# Patient Record
Sex: Female | Born: 1935 | Race: White | Hispanic: No | Marital: Married | Smoking: Former smoker
Health system: Southern US, Community
[De-identification: ages and names within clinical notes are randomized; demographics above are authoritative.]

## PROBLEM LIST (undated history)

## (undated) DIAGNOSIS — M549 Dorsalgia, unspecified: Secondary | ICD-10-CM

## (undated) DIAGNOSIS — M797 Fibromyalgia: Secondary | ICD-10-CM

## (undated) DIAGNOSIS — J189 Pneumonia, unspecified organism: Secondary | ICD-10-CM

## (undated) DIAGNOSIS — G8929 Other chronic pain: Secondary | ICD-10-CM

## (undated) DIAGNOSIS — Z8719 Personal history of other diseases of the digestive system: Secondary | ICD-10-CM

## (undated) DIAGNOSIS — J42 Unspecified chronic bronchitis: Secondary | ICD-10-CM

## (undated) DIAGNOSIS — M1990 Unspecified osteoarthritis, unspecified site: Secondary | ICD-10-CM

## (undated) DIAGNOSIS — I10 Essential (primary) hypertension: Secondary | ICD-10-CM

## (undated) DIAGNOSIS — Z950 Presence of cardiac pacemaker: Secondary | ICD-10-CM

## (undated) DIAGNOSIS — G43909 Migraine, unspecified, not intractable, without status migrainosus: Secondary | ICD-10-CM

## (undated) DIAGNOSIS — E785 Hyperlipidemia, unspecified: Secondary | ICD-10-CM

## (undated) DIAGNOSIS — I480 Paroxysmal atrial fibrillation: Secondary | ICD-10-CM

## (undated) DIAGNOSIS — C50912 Malignant neoplasm of unspecified site of left female breast: Secondary | ICD-10-CM

## (undated) DIAGNOSIS — K5792 Diverticulitis of intestine, part unspecified, without perforation or abscess without bleeding: Secondary | ICD-10-CM

## (undated) DIAGNOSIS — K219 Gastro-esophageal reflux disease without esophagitis: Secondary | ICD-10-CM

## (undated) DIAGNOSIS — I714 Abdominal aortic aneurysm, without rupture, unspecified: Secondary | ICD-10-CM

## (undated) DIAGNOSIS — I739 Peripheral vascular disease, unspecified: Secondary | ICD-10-CM

## (undated) DIAGNOSIS — Z9289 Personal history of other medical treatment: Secondary | ICD-10-CM

## (undated) DIAGNOSIS — I2510 Atherosclerotic heart disease of native coronary artery without angina pectoris: Secondary | ICD-10-CM

## (undated) DIAGNOSIS — Z9981 Dependence on supplemental oxygen: Secondary | ICD-10-CM

## (undated) DIAGNOSIS — F329 Major depressive disorder, single episode, unspecified: Secondary | ICD-10-CM

## (undated) DIAGNOSIS — E039 Hypothyroidism, unspecified: Secondary | ICD-10-CM

## (undated) DIAGNOSIS — J449 Chronic obstructive pulmonary disease, unspecified: Secondary | ICD-10-CM

## (undated) DIAGNOSIS — K254 Chronic or unspecified gastric ulcer with hemorrhage: Secondary | ICD-10-CM

## (undated) DIAGNOSIS — J439 Emphysema, unspecified: Secondary | ICD-10-CM

## (undated) DIAGNOSIS — D649 Anemia, unspecified: Secondary | ICD-10-CM

## (undated) DIAGNOSIS — I219 Acute myocardial infarction, unspecified: Secondary | ICD-10-CM

## (undated) DIAGNOSIS — I7090 Unspecified atherosclerosis: Secondary | ICD-10-CM

## (undated) DIAGNOSIS — F32A Depression, unspecified: Secondary | ICD-10-CM

## (undated) DIAGNOSIS — F419 Anxiety disorder, unspecified: Secondary | ICD-10-CM

## (undated) HISTORY — PX: CATARACT EXTRACTION W/ INTRAOCULAR LENS  IMPLANT, BILATERAL: SHX1307

## (undated) HISTORY — PX: POSTERIOR LUMBAR FUSION: SHX6036

## (undated) HISTORY — PX: SHOULDER ARTHROSCOPY W/ ROTATOR CUFF REPAIR: SHX2400

## (undated) HISTORY — PX: CORONARY ANGIOPLASTY WITH STENT PLACEMENT: SHX49

## (undated) HISTORY — DX: Peripheral vascular disease, unspecified: I73.9

## (undated) HISTORY — DX: Essential (primary) hypertension: I10

## (undated) HISTORY — PX: TOTAL KNEE ARTHROPLASTY: SHX125

## (undated) HISTORY — DX: Gastro-esophageal reflux disease without esophagitis: K21.9

## (undated) HISTORY — PX: CAROTID ENDARTERECTOMY: SUR193

## (undated) HISTORY — DX: Atherosclerotic heart disease of native coronary artery without angina pectoris: I25.10

## (undated) HISTORY — PX: APPENDECTOMY: SHX54

## (undated) HISTORY — DX: Unspecified atherosclerosis: I70.90

## (undated) HISTORY — PX: FEMORAL BYPASS: SHX50

## (undated) HISTORY — PX: BLADDER SURGERY: SHX569

## (undated) HISTORY — PX: FOOT SURGERY: SHX648

## (undated) HISTORY — PX: BREAST BIOPSY: SHX20

## (undated) HISTORY — PX: MASTECTOMY: SHX3

## (undated) HISTORY — PX: INSERTION OF ILIAC STENT: SHX6256

## (undated) HISTORY — DX: Chronic obstructive pulmonary disease, unspecified: J44.9

## (undated) HISTORY — PX: TUBAL LIGATION: SHX77

## (undated) HISTORY — PX: INSERT / REPLACE / REMOVE PACEMAKER: SUR710

## (undated) HISTORY — PX: JOINT REPLACEMENT: SHX530

## (undated) HISTORY — DX: Hyperlipidemia, unspecified: E78.5

## (undated) HISTORY — DX: Hypothyroidism, unspecified: E03.9

## (undated) HISTORY — PX: BACK SURGERY: SHX140

## (undated) HISTORY — DX: Diverticulitis of intestine, part unspecified, without perforation or abscess without bleeding: K57.92

## (undated) HISTORY — DX: Paroxysmal atrial fibrillation: I48.0

## (undated) HISTORY — PX: CARPAL TUNNEL RELEASE: SHX101

## (undated) HISTORY — PX: CORONARY ARTERY BYPASS GRAFT: SHX141

---

## 2008-05-25 ENCOUNTER — Encounter: Admission: RE | Admit: 2008-05-25 | Discharge: 2008-05-25 | Payer: Self-pay | Admitting: Surgery

## 2008-05-25 ENCOUNTER — Other Ambulatory Visit: Admission: RE | Admit: 2008-05-25 | Discharge: 2008-05-25 | Payer: Self-pay | Admitting: Interventional Radiology

## 2008-05-25 ENCOUNTER — Encounter (INDEPENDENT_AMBULATORY_CARE_PROVIDER_SITE_OTHER): Payer: Self-pay | Admitting: Interventional Radiology

## 2008-07-30 HISTORY — PX: TOTAL THYROIDECTOMY: SHX2547

## 2008-08-03 ENCOUNTER — Encounter (INDEPENDENT_AMBULATORY_CARE_PROVIDER_SITE_OTHER): Payer: Self-pay | Admitting: Surgery

## 2008-08-03 ENCOUNTER — Ambulatory Visit (HOSPITAL_COMMUNITY): Admission: RE | Admit: 2008-08-03 | Discharge: 2008-08-04 | Payer: Self-pay | Admitting: Surgery

## 2009-12-03 ENCOUNTER — Emergency Department (HOSPITAL_COMMUNITY): Admission: EM | Admit: 2009-12-03 | Discharge: 2009-12-03 | Payer: Self-pay | Admitting: Emergency Medicine

## 2010-10-21 ENCOUNTER — Encounter: Payer: Self-pay | Admitting: Surgery

## 2010-12-22 LAB — POCT I-STAT, CHEM 8
Calcium, Ion: 1.13 mmol/L (ref 1.12–1.32)
Glucose, Bld: 96 mg/dL (ref 70–99)
HCT: 42 % (ref 36.0–46.0)
Hemoglobin: 14.3 g/dL (ref 12.0–15.0)
Potassium: 4 mEq/L (ref 3.5–5.1)

## 2011-02-11 NOTE — Op Note (Signed)
Rita Holmes, Rita Holmes                ACCOUNT NO.:  [id number]   MEDICAL RECORD NO.:  [id number]          PATIENT TYPE:  OIB   LOCATION:  5114                         FACILITY:  [facility]   PHYSICIAN:  Velora Heckler, MD      DATE OF BIRTH:  06/13/1931   DATE OF PROCEDURE:  08/03/2008  DATE OF DISCHARGE:                               OPERATIVE REPORT   PREOPERATIVE DIAGNOSIS:  Bilateral thyroid nodules with cytologic  atypia.   POSTOPERATIVE DIAGNOSIS:  Bilateral thyroid nodules with cytologic  atypia.   PROCEDURE:  Total thyroidectomy.   SURGEON:  Velora Heckler, MD, FACS   ASSISTANT:  Currie Paris, MD, FACS   ANESTHESIA:  General.   ESTIMATED BLOOD LOSS:  Minimal.   PREPARATION:  ChloraPrep.   COMPLICATIONS:  None.   INDICATIONS:  The patient is a 77 year old white female from [place],  [place].  The patient was evaluated by Dr. Nadine Counts for  bilateral thyroid nodules.  The patient notes that the nodules had  increased in size over the past year.  CT scan in June 2009 at [facility] showed a mass in the left thyroid lobe.  Other nodules were  noted in the right thyroid lobe.  The patient underwent fine-needle  aspiration biopsy.  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  The patient now comes to surgery for resection of the  thyroid for definitive diagnosis.   BODY OF REPORT:  Procedure was done in OR #16 at the [facility].  The patient was brought to the operating room and  placed in a supine position on the operating room table.  Following  administration of general anesthesia, the patient was prepped and draped  in the usual strict aseptic fashion.  After ascertaining that an  adequate level of anesthesia had been achieved, a Kocher incision was  made with a #15 blade.  Dissection was carried through subcutaneous  tissues and platysma.  Hemostasis was obtained with the electrocautery.  Skin flaps were  elevated cephalad and caudad from the thyroid notch to  the sternal notch.  A Mahorner self-retaining retractor was placed for  exposure.  Strap muscles were incised in the midline and dissection was  begun on the left side.  Left thyroid lobe was exposed.  Vascular  tributaries to the left lobe appeared to be calcified.  These were  divided between Ligaclips with the harmonic scalpel.  Gland was gently  mobilized.  There was a dominant mass occupying a large portion of the  left lobe.  It was soft, mobile, and does not appear to be locally  invasive.  Inferior venous tributaries were divided between medium  Ligaclips with the harmonic scalpel.  Superior pole vessels were  dissected out, ligated in continuity with 2-0 silk ties, and divided  with the harmonic scalpel.  Gland was rolled anteriorly.  Branches of  the inferior thyroid artery were divided between Ligaclips with the  harmonic scalpel.  Parathyroid tissue was identified and preserved in  both the superior and inferior  locations on the left.  Recurrent nerve  was identified and preserved.  Ligament of Allyson Sabal was transected with the  electrocautery and gland was mobilized up and onto the anterior trachea.  There was no significant pyramidal lobe.  Dry pack was placed in the  left neck.   Next, we turned our attention to the right side.  Right strap muscles  were reflected laterally.  The right lobe was exposed.  It contains  multiple small nodules.  Superior pole vessels were dissected out and  divided between medium Ligaclips with the harmonic scalpel.  Inferior  venous tributaries were ligated in continuity with 2-0 silk ties and  divided with the harmonic scalpel.  Parathyroid tissue was again  identified in the right inferior position and preserved.  Branches of  the inferior thyroid artery were divided between small Ligaclips with  the harmonic scalpel.  Recurrent nerve was identified and preserved.  Ligament of Allyson Sabal was  transected with the electrocautery and the gland  was mobilized up and onto the anterior trachea.  Branches of vessels  coming in superiorly to the isthmus were ligated in continuity with 2-0  silk ties and divided with the harmonic scalpel.  Specimen was  completely excised and submitted to pathology for review.  A suture  marks the right superior pole.   Neck was irrigated with warm saline.  Surgicel was placed in the  operative field.  Strap muscles were reapproximated in the midline with  interrupted 3-0 Vicryl sutures.  Platysma was closed with interrupted 3-  0 Vicryl sutures.  Skin was closed with running 4-0 Monocryl  subcuticular suture.  Wound was washed and dried and benzoin and Steri-  Strips were applied.  Sterile dressings were applied.  The patient was  awakened from anesthesia and brought to the recovery room in stable  condition.  The patient tolerated the procedure well.      Velora Heckler, MD  Electronically Signed     TMG/MEDQ  D:  08/03/2008  T:  08/04/2008  Job:  [id number]   cc:   Nadine Counts  Dr. Luberta Robertson

## 2011-07-01 LAB — DIFFERENTIAL
Basophils Absolute: 0
Basophils Relative: 0
Eosinophils Absolute: 0.1
Eosinophils Relative: 1
Lymphocytes Relative: 38
Lymphs Abs: 2.2
Monocytes Relative: 7
Neutrophils Relative %: 55

## 2011-07-01 LAB — COMPREHENSIVE METABOLIC PANEL
Albumin: 4.1
CO2: 29
Calcium: 9.4
Chloride: 104
Creatinine, Ser: 0.67
Potassium: 4.5

## 2011-07-01 LAB — URINALYSIS, ROUTINE W REFLEX MICROSCOPIC
Protein, ur: NEGATIVE
Urobilinogen, UA: 0.2

## 2011-07-01 LAB — CBC
HCT: 38.8
Platelets: 183
RBC: 4.03
RDW: 13
WBC: 5.8

## 2011-07-01 LAB — PROTIME-INR
INR: 0.9
Prothrombin Time: 12.7

## 2011-07-01 LAB — CALCIUM
Calcium: 9
Calcium: 9.2

## 2017-07-23 DIAGNOSIS — R0789 Other chest pain: Secondary | ICD-10-CM

## 2018-01-07 ENCOUNTER — Ambulatory Visit (INDEPENDENT_AMBULATORY_CARE_PROVIDER_SITE_OTHER): Payer: Medicare Other | Admitting: Vascular Surgery

## 2018-01-07 ENCOUNTER — Encounter: Payer: Self-pay | Admitting: Vascular Surgery

## 2018-01-07 ENCOUNTER — Other Ambulatory Visit: Payer: Self-pay

## 2018-01-07 VITALS — BP 122/62 | HR 62 | Temp 97.1°F | Resp 14 | Ht 65.0 in | Wt 150.0 lb

## 2018-01-07 DIAGNOSIS — I739 Peripheral vascular disease, unspecified: Secondary | ICD-10-CM | POA: Diagnosis not present

## 2018-01-07 NOTE — Progress Notes (Signed)
Vascular and Vein Specialist of [facility]  Patient name: Rita Holmes. Mcelveen MRN: [id number] DOB: Dec 03, 1930 Sex: female  REASON FOR CONSULT: Lower extremity arterial insufficiency  HPI: [name] is a 84 y.o. female, who is here for discussion of lower extremity arterial insufficiency.  She is a very pleasant 84 year old in for evaluation of peripheral vascular occlusive disease.  She was living independently but apparently had a recent admission to [facility] hospital with patient is in shortness of breath.  She was admitted from 3/5 to 12/07/2017 she had atrial fibrillation rapid ventricular respons COPD D exacerbation and was found to have a low TSH.  She was discharged to a nursing facility and reports that she is to be discharged from a nursing facility to home later today.  She does have complaints of pain in both lower extremities extending into her feet.  This can happen at night but also can happen in a day and is not related to exercise.  He has no history of tissue loss.  She does have a very superficial abrasion over her lateral ankle which appears to be healing.  Her peripheral vascular occlusive disease.  She has had bilateral carotid endarterectomies in the distant past and also has had a left femoral to popliteal bypass which according to her was with prosthetic graft.  She does not remember the specific indications for this surgery.  Also has a history of abdominal aortic aneurysm but reports no recent imaging studies.  She reports that her husband is spent the last 3 years and nursing facility.  Past Medical History:  Diagnosis Date  . Arterial occlusive disease   . Atherosclerotic heart disease   . Cancer ([place])    breast  . COPD (chronic obstructive pulmonary disease) ([place])   . Diverticulitis   . Gastroesophageal reflux disease   . Hyperlipemia   . Hypertension   . Hypothyroidism   . Paroxysmal A-fib ([place])   . PVD (peripheral  vascular disease) ([place])     History reviewed. No pertinent family history.  SOCIAL HISTORY: Social History   Socioeconomic History  . Marital status: Married    Spouse name: Not on file  . Number of children: Not on file  . Years of education: Not on file  . Highest education level: Not on file  Occupational History  . Not on file  Social Needs  . Financial resource strain: Not on file  . Food insecurity:    Worry: Not on file    Inability: Not on file  . Transportation needs:    Medical: Not on file    Non-medical: Not on file  Tobacco Use  . Smoking status: Former Smoker    Types: Cigarettes    Last attempt to quit: 03/09/1994    Years since quitting: 23.8  . Smokeless tobacco: Never Used  Substance and Sexual Activity  . Alcohol use: Never    Frequency: Never  . Drug use: Never  . Sexual activity: Not on file  Lifestyle  . Physical activity:    Days per week: Not on file    Minutes per session: Not on file  . Stress: Not on file  Relationships  . Social connections:    Talks on phone: Not on file    Gets together: Not on file    Attends religious service: Not on file    Active member of club or organization: Not on file    Attends meetings of clubs or organizations: Not  on file    Relationship status: Not on file  . Intimate partner violence:    Fear of current or ex partner: Not on file    Emotionally abused: Not on file    Physically abused: Not on file    Forced sexual activity: Not on file  Other Topics Concern  . Not on file  Social History Narrative  . Not on file    Allergies  Allergen Reactions  . Atorvastatin Other (See Comments) and Hives    Hives.   . Diclofenac Sodium Nausea And Vomiting and Palpitations  . Hydrocodone Itching    Current Outpatient Medications  Medication Sig Dispense Refill  . acetaminophen (TYLENOL) 650 MG CR tablet Take by mouth.    Marland Kitchen albuterol (PROVENTIL HFA) 108 (90 Base) MCG/ACT inhaler Inhale into the lungs.     Marland Kitchen amiodarone (PACERONE) 200 MG tablet Take 200 mg by mouth daily.    Marland Kitchen azelastine (ASTELIN) 0.1 % nasal spray Place into the nose.    . Calcium Carb-Ergocalciferol 500-200 MG-UNIT TABS Take by mouth.    . Cholecalciferol (VITAMIN D-1000 MAX ST) 1000 units tablet Take by mouth.    . DULoxetine (CYMBALTA) 30 MG capsule TAKE 1 CAPSULE BY MOUTH ONCE DAILY    . furosemide (LASIX) 20 MG tablet Take by mouth.    . gabapentin (NEURONTIN) 300 MG capsule Take by mouth.    . metoprolol tartrate (LOPRESSOR) 25 MG tablet Take by mouth.    . nitroGLYCERIN (NITROSTAT) 0.4 MG SL tablet Place under the tongue.    . pravastatin (PRAVACHOL) 40 MG tablet Take by mouth.    . rivaroxaban (XARELTO) 10 MG TABS tablet Take by mouth.    . tamoxifen (NOLVADEX) 20 MG tablet TAKE ONE TABLET BY MOUTH ONCE DAILY     No current facility-administered medications for this visit.     REVIEW OF SYSTEMS:  [X]  denotes positive finding, [ ]  denotes negative finding Cardiac  Comments:  Chest pain or chest pressure:    Shortness of breath upon exertion:    Short of breath when lying flat:    Irregular heart rhythm: x       Vascular    Pain in calf, thigh, or hip brought on by ambulation:    Pain in feet at night that wakes you up from your sleep:  x   Blood clot in your veins:    Leg swelling:         Pulmonary    Oxygen at home:    Productive cough:     Wheezing:         Neurologic    Sudden weakness in arms or legs:     Sudden numbness in arms or legs:     Sudden onset of difficulty speaking or slurred speech:    Temporary loss of vision in one eye:     Problems with dizziness:         Gastrointestinal    Blood in stool:     Vomited blood:         Genitourinary    Burning when urinating:     Blood in urine:        Psychiatric    Major depression:         Hematologic    Bleeding problems:    Problems with blood clotting too easily:        Skin    Rashes or ulcers:        Constitutional  Fever or chills:      PHYSICAL EXAM: Vitals:   01/07/18 1023  BP: 122/62  Pulse: 62  Resp: 14  Temp: (!) 97.1 F (36.2 C)  TempSrc: Oral  SpO2: 94%  Weight: 150 lb (68 kg)  Height: 5\' 5"  (1.651 m)    GENERAL: The patient is a well-nourished female, in no acute distress. The vital signs are documented above. CARDIOVASCULAR: Well-healed carotid incisions bilaterally with no carotid bruits.  2+ radial pulses bilaterally.  Absent pedal pulses bilaterally. PULMONARY: There is good air exchange  ABDOMEN: Soft and non-tender  MUSCULOSKELETAL: There are no major deformities or cyanosis. NEUROLOGIC: No focal weakness or paresthesias are detected. SKIN: There are no ulcers or rashes noted.  Very superficial abrasion over her left lateral ankle.  Feet are well perfused no changes of chronic ischemia PSYCHIATRIC: The patient has a normal affect.  DATA:  She did have a duplex of her lower extremities on 12/15/2017 and I have these for review.  This shows a probable occlusion of her superficial femoral arteries bilaterally.  It appears as though her old left femoral to popliteal bypass is occluded as well  MEDICAL ISSUES: Had a long discussion with the patient.  I do not feel that she is in any immediate risk for limb loss.  I am certainly not convinced that her arterial insufficiency is causing her leg and feet pain since this is not typical.  Her feet appear to be well perfused and not at the critical limb ischemia stage.  I have recommended that we see her again in 2 months for continued follow-up and discussion after she has been discharged from the nursing facility.  She reports that her discomfort has been present for at least months if not a year.  She does report pulsatile tinnitus and has had no recent carotid imaging studies so therefore will obtain carotid duplex on her next visit.  Also will do ultrasound of her abdominal aortic aneurysm for follow-up and documentation as well.  She  will notify should she develop any tissue loss in the next 2 months and otherwise will be seen for continued follow-up   Rosetta Posner, MD [facility] Vascular and Vein Specialists of [facility] Tel [phone] Pager [phone]

## 2018-01-11 ENCOUNTER — Other Ambulatory Visit: Payer: Self-pay

## 2018-01-11 DIAGNOSIS — I6523 Occlusion and stenosis of bilateral carotid arteries: Secondary | ICD-10-CM

## 2018-01-11 DIAGNOSIS — I714 Abdominal aortic aneurysm, without rupture, unspecified: Secondary | ICD-10-CM

## 2018-01-11 DIAGNOSIS — I739 Peripheral vascular disease, unspecified: Secondary | ICD-10-CM

## 2018-03-16 ENCOUNTER — Encounter (HOSPITAL_COMMUNITY): Payer: Medicare Other

## 2018-03-16 ENCOUNTER — Other Ambulatory Visit (HOSPITAL_COMMUNITY): Payer: Medicare Other

## 2018-03-16 ENCOUNTER — Ambulatory Visit: Payer: Medicare Other | Admitting: Vascular Surgery

## 2018-03-18 ENCOUNTER — Emergency Department (HOSPITAL_COMMUNITY): Payer: Medicare Other

## 2018-03-18 ENCOUNTER — Inpatient Hospital Stay (HOSPITAL_COMMUNITY)
Admission: EM | Admit: 2018-03-18 | Discharge: 2018-03-23 | DRG: 309 | Disposition: A | Discharge status: Skilled Nursing Facility | Payer: Medicare Other | Attending: Internal Medicine | Admitting: Internal Medicine

## 2018-03-18 ENCOUNTER — Other Ambulatory Visit: Payer: Self-pay

## 2018-03-18 ENCOUNTER — Encounter (HOSPITAL_COMMUNITY): Payer: Self-pay

## 2018-03-18 DIAGNOSIS — Z87891 Personal history of nicotine dependence: Secondary | ICD-10-CM | POA: Diagnosis not present

## 2018-03-18 DIAGNOSIS — I4891 Unspecified atrial fibrillation: Secondary | ICD-10-CM | POA: Diagnosis present

## 2018-03-18 DIAGNOSIS — I252 Old myocardial infarction: Secondary | ICD-10-CM | POA: Diagnosis not present

## 2018-03-18 DIAGNOSIS — M79604 Pain in right leg: Secondary | ICD-10-CM

## 2018-03-18 DIAGNOSIS — R109 Unspecified abdominal pain: Secondary | ICD-10-CM

## 2018-03-18 DIAGNOSIS — R509 Fever, unspecified: Secondary | ICD-10-CM | POA: Diagnosis not present

## 2018-03-18 DIAGNOSIS — R52 Pain, unspecified: Secondary | ICD-10-CM | POA: Diagnosis not present

## 2018-03-18 DIAGNOSIS — R778 Other specified abnormalities of plasma proteins: Secondary | ICD-10-CM | POA: Diagnosis present

## 2018-03-18 DIAGNOSIS — T447X5A Adverse effect of beta-adrenoreceptor antagonists, initial encounter: Secondary | ICD-10-CM | POA: Diagnosis not present

## 2018-03-18 DIAGNOSIS — I2510 Atherosclerotic heart disease of native coronary artery without angina pectoris: Secondary | ICD-10-CM | POA: Diagnosis present

## 2018-03-18 DIAGNOSIS — Z885 Allergy status to narcotic agent status: Secondary | ICD-10-CM

## 2018-03-18 DIAGNOSIS — I110 Hypertensive heart disease with heart failure: Secondary | ICD-10-CM | POA: Diagnosis present

## 2018-03-18 DIAGNOSIS — I081 Rheumatic disorders of both mitral and tricuspid valves: Secondary | ICD-10-CM | POA: Diagnosis present

## 2018-03-18 DIAGNOSIS — I82491 Acute embolism and thrombosis of other specified deep vein of right lower extremity: Secondary | ICD-10-CM | POA: Diagnosis present

## 2018-03-18 DIAGNOSIS — I70201 Unspecified atherosclerosis of native arteries of extremities, right leg: Secondary | ICD-10-CM | POA: Diagnosis present

## 2018-03-18 DIAGNOSIS — Z853 Personal history of malignant neoplasm of breast: Secondary | ICD-10-CM

## 2018-03-18 DIAGNOSIS — I248 Other forms of acute ischemic heart disease: Secondary | ICD-10-CM | POA: Diagnosis present

## 2018-03-18 DIAGNOSIS — Z79810 Long term (current) use of selective estrogen receptor modulators (SERMs): Secondary | ICD-10-CM

## 2018-03-18 DIAGNOSIS — J439 Emphysema, unspecified: Secondary | ICD-10-CM

## 2018-03-18 DIAGNOSIS — I739 Peripheral vascular disease, unspecified: Secondary | ICD-10-CM | POA: Diagnosis present

## 2018-03-18 DIAGNOSIS — I4510 Unspecified right bundle-branch block: Secondary | ICD-10-CM | POA: Diagnosis present

## 2018-03-18 DIAGNOSIS — Z950 Presence of cardiac pacemaker: Secondary | ICD-10-CM

## 2018-03-18 DIAGNOSIS — L8990 Pressure ulcer of unspecified site, unspecified stage: Secondary | ICD-10-CM

## 2018-03-18 DIAGNOSIS — R0789 Other chest pain: Secondary | ICD-10-CM | POA: Diagnosis present

## 2018-03-18 DIAGNOSIS — Z96651 Presence of right artificial knee joint: Secondary | ICD-10-CM | POA: Diagnosis present

## 2018-03-18 DIAGNOSIS — T82856A Stenosis of peripheral vascular stent, initial encounter: Secondary | ICD-10-CM | POA: Diagnosis present

## 2018-03-18 DIAGNOSIS — E785 Hyperlipidemia, unspecified: Secondary | ICD-10-CM | POA: Diagnosis present

## 2018-03-18 DIAGNOSIS — I480 Paroxysmal atrial fibrillation: Secondary | ICD-10-CM | POA: Diagnosis present

## 2018-03-18 DIAGNOSIS — Z886 Allergy status to analgesic agent status: Secondary | ICD-10-CM

## 2018-03-18 DIAGNOSIS — I714 Abdominal aortic aneurysm, without rupture: Secondary | ICD-10-CM | POA: Diagnosis present

## 2018-03-18 DIAGNOSIS — M797 Fibromyalgia: Secondary | ICD-10-CM | POA: Diagnosis present

## 2018-03-18 DIAGNOSIS — E890 Postprocedural hypothyroidism: Secondary | ICD-10-CM | POA: Diagnosis present

## 2018-03-18 DIAGNOSIS — Z79899 Other long term (current) drug therapy: Secondary | ICD-10-CM

## 2018-03-18 DIAGNOSIS — E039 Hypothyroidism, unspecified: Secondary | ICD-10-CM | POA: Diagnosis not present

## 2018-03-18 DIAGNOSIS — I952 Hypotension due to drugs: Secondary | ICD-10-CM | POA: Diagnosis not present

## 2018-03-18 DIAGNOSIS — Z888 Allergy status to other drugs, medicaments and biological substances status: Secondary | ICD-10-CM

## 2018-03-18 DIAGNOSIS — R609 Edema, unspecified: Secondary | ICD-10-CM | POA: Diagnosis not present

## 2018-03-18 DIAGNOSIS — R1031 Right lower quadrant pain: Secondary | ICD-10-CM | POA: Diagnosis present

## 2018-03-18 DIAGNOSIS — D539 Nutritional anemia, unspecified: Secondary | ICD-10-CM | POA: Diagnosis present

## 2018-03-18 DIAGNOSIS — I10 Essential (primary) hypertension: Secondary | ICD-10-CM | POA: Diagnosis present

## 2018-03-18 DIAGNOSIS — Z95820 Peripheral vascular angioplasty status with implants and grafts: Secondary | ICD-10-CM

## 2018-03-18 DIAGNOSIS — R079 Chest pain, unspecified: Secondary | ICD-10-CM | POA: Diagnosis present

## 2018-03-18 DIAGNOSIS — Z9981 Dependence on supplemental oxygen: Secondary | ICD-10-CM

## 2018-03-18 DIAGNOSIS — Z955 Presence of coronary angioplasty implant and graft: Secondary | ICD-10-CM

## 2018-03-18 DIAGNOSIS — Z951 Presence of aortocoronary bypass graft: Secondary | ICD-10-CM

## 2018-03-18 DIAGNOSIS — Z7901 Long term (current) use of anticoagulants: Secondary | ICD-10-CM

## 2018-03-18 DIAGNOSIS — M79661 Pain in right lower leg: Secondary | ICD-10-CM | POA: Diagnosis not present

## 2018-03-18 DIAGNOSIS — J449 Chronic obstructive pulmonary disease, unspecified: Secondary | ICD-10-CM | POA: Diagnosis present

## 2018-03-18 DIAGNOSIS — K219 Gastro-esophageal reflux disease without esophagitis: Secondary | ICD-10-CM | POA: Diagnosis present

## 2018-03-18 DIAGNOSIS — R059 Cough, unspecified: Secondary | ICD-10-CM

## 2018-03-18 DIAGNOSIS — R748 Abnormal levels of other serum enzymes: Secondary | ICD-10-CM | POA: Diagnosis not present

## 2018-03-18 DIAGNOSIS — R05 Cough: Secondary | ICD-10-CM

## 2018-03-18 DIAGNOSIS — R7989 Other specified abnormal findings of blood chemistry: Secondary | ICD-10-CM | POA: Diagnosis present

## 2018-03-18 DIAGNOSIS — I5022 Chronic systolic (congestive) heart failure: Secondary | ICD-10-CM | POA: Diagnosis not present

## 2018-03-18 DIAGNOSIS — Z7902 Long term (current) use of antithrombotics/antiplatelets: Secondary | ICD-10-CM

## 2018-03-18 HISTORY — DX: Unspecified chronic bronchitis: J42

## 2018-03-18 HISTORY — DX: Malignant neoplasm of unspecified site of left female breast: C50.912

## 2018-03-18 HISTORY — DX: Dorsalgia, unspecified: M54.9

## 2018-03-18 HISTORY — DX: Anemia, unspecified: D64.9

## 2018-03-18 HISTORY — DX: Major depressive disorder, single episode, unspecified: F32.9

## 2018-03-18 HISTORY — DX: Other chronic pain: G89.29

## 2018-03-18 HISTORY — DX: Personal history of other diseases of the digestive system: Z87.19

## 2018-03-18 HISTORY — DX: Emphysema, unspecified: J43.9

## 2018-03-18 HISTORY — DX: Migraine, unspecified, not intractable, without status migrainosus: G43.909

## 2018-03-18 HISTORY — DX: Acute myocardial infarction, unspecified: I21.9

## 2018-03-18 HISTORY — DX: Personal history of other medical treatment: Z92.89

## 2018-03-18 HISTORY — DX: Chronic or unspecified gastric ulcer with hemorrhage: K25.4

## 2018-03-18 HISTORY — DX: Peripheral vascular disease, unspecified: I73.9

## 2018-03-18 HISTORY — DX: Presence of cardiac pacemaker: Z95.0

## 2018-03-18 HISTORY — DX: Fibromyalgia: M79.7

## 2018-03-18 HISTORY — DX: Dependence on supplemental oxygen: Z99.81

## 2018-03-18 HISTORY — DX: Depression, unspecified: F32.A

## 2018-03-18 HISTORY — DX: Unspecified osteoarthritis, unspecified site: M19.90

## 2018-03-18 HISTORY — DX: Pneumonia, unspecified organism: J18.9

## 2018-03-18 HISTORY — DX: Anxiety disorder, unspecified: F41.9

## 2018-03-18 HISTORY — DX: Abdominal aortic aneurysm, without rupture, unspecified: I71.40

## 2018-03-18 HISTORY — DX: Abdominal aortic aneurysm, without rupture: I71.4

## 2018-03-18 LAB — CBC WITH DIFFERENTIAL/PLATELET
Abs Immature Granulocytes: 0.1 10*3/uL (ref 0.0–0.1)
BASOS ABS: 0 10*3/uL (ref 0.0–0.1)
BASOS PCT: 0 %
EOS ABS: 0 10*3/uL (ref 0.0–0.7)
Eosinophils Relative: 1 %
HCT: 31.3 % — ABNORMAL LOW (ref 36.0–46.0)
Hemoglobin: 9.7 g/dL — ABNORMAL LOW (ref 12.0–15.0)
IMMATURE GRANULOCYTES: 1 %
Lymphocytes Relative: 21 %
Lymphs Abs: 1.6 10*3/uL (ref 0.7–4.0)
MCH: 32.4 pg (ref 26.0–34.0)
MCHC: 31 g/dL (ref 30.0–36.0)
MCV: 104.7 fL — AB (ref 78.0–100.0)
MONOS PCT: 8 %
Monocytes Absolute: 0.6 10*3/uL (ref 0.1–1.0)
NEUTROS PCT: 69 %
Neutro Abs: 5.2 10*3/uL (ref 1.7–7.7)
PLATELETS: 164 10*3/uL (ref 150–400)
RBC: 2.99 MIL/uL — AB (ref 3.87–5.11)
RDW: 16.2 % — AB (ref 11.5–15.5)
WBC: 7.5 10*3/uL (ref 4.0–10.5)

## 2018-03-18 LAB — I-STAT TROPONIN, ED: Troponin i, poc: 0.16 ng/mL (ref 0.00–0.08)

## 2018-03-18 LAB — PROTIME-INR
INR: 1.25
Prothrombin Time: 15.6 seconds — ABNORMAL HIGH (ref 11.4–15.2)

## 2018-03-18 LAB — COMPREHENSIVE METABOLIC PANEL
ALT: 12 U/L — ABNORMAL LOW (ref 14–54)
AST: 19 U/L (ref 15–41)
Albumin: 3 g/dL — ABNORMAL LOW (ref 3.5–5.0)
Alkaline Phosphatase: 29 U/L — ABNORMAL LOW (ref 38–126)
Anion gap: 9 (ref 5–15)
BUN: 12 mg/dL (ref 6–20)
CHLORIDE: 109 mmol/L (ref 101–111)
CO2: 21 mmol/L — ABNORMAL LOW (ref 22–32)
Calcium: 7.7 mg/dL — ABNORMAL LOW (ref 8.9–10.3)
Creatinine, Ser: 0.96 mg/dL (ref 0.44–1.00)
GFR calc Af Amer: >60 mL/min (ref >60)
GFR, EST NON AFRICAN AMERICAN: 52 mL/min — AB (ref >60)
Glucose, Bld: 116 mg/dL — ABNORMAL HIGH (ref 65–99)
POTASSIUM: 4.1 mmol/L (ref 3.5–5.1)
Sodium: 139 mmol/L (ref 135–145)
Total Bilirubin: 0.6 mg/dL (ref 0.3–1.2)
Total Protein: 5.1 g/dL — ABNORMAL LOW (ref 6.5–8.1)

## 2018-03-18 LAB — ABO/RH: ABO/RH(D): O POS

## 2018-03-18 LAB — TYPE AND SCREEN
ABO/RH(D): O POS
ANTIBODY SCREEN: NEGATIVE

## 2018-03-18 LAB — TROPONIN I: TROPONIN I: 0.16 ng/mL — AB (ref <0.03)

## 2018-03-18 MED ORDER — LIDOCAINE HCL (PF) 1 % IJ SOLN
INTRAMUSCULAR | Status: AC
  Filled 2018-03-18: qty 30

## 2018-03-18 MED ORDER — ACETAMINOPHEN 325 MG PO TABS: 650.0000 mg | ORAL_TABLET | ORAL | Status: DC | PRN

## 2018-03-18 MED ORDER — SODIUM CHLORIDE 0.9 % IV BOLUS
1000.0000 mL | Freq: Once | INTRAVENOUS | Status: AC
  Administered 2018-03-18: 1000 mL via INTRAVENOUS

## 2018-03-18 MED ORDER — FLUTICASONE FUROATE-VILANTEROL 100-25 MCG/INH IN AEPB
1.0000 | INHALATION_SPRAY | Freq: Every day | RESPIRATORY_TRACT | Status: DC
  Administered 2018-03-19 – 2018-03-23 (×3): 1 via RESPIRATORY_TRACT
  Filled 2018-03-18 (×2): qty 28

## 2018-03-18 MED ORDER — SODIUM CHLORIDE 0.9 % IV SOLN: 250.0000 mL | INTRAVENOUS | Status: DC | PRN

## 2018-03-18 MED ORDER — ONDANSETRON HCL 4 MG/2ML IJ SOLN
4.0000 mg | Freq: Once | INTRAMUSCULAR | Status: AC
  Administered 2018-03-18: 4 mg via INTRAVENOUS
  Filled 2018-03-18: qty 2

## 2018-03-18 MED ORDER — METOPROLOL TARTRATE 5 MG/5ML IV SOLN
2.5000 mg | Freq: Once | INTRAVENOUS | Status: AC
  Administered 2018-03-18: 2.5 mg via INTRAVENOUS
  Filled 2018-03-18 (×2): qty 5

## 2018-03-18 MED ORDER — SODIUM CHLORIDE 0.9% FLUSH
3.0000 mL | Freq: Two times a day (BID) | INTRAVENOUS | Status: DC
  Administered 2018-03-18 – 2018-03-22 (×9): 3 mL via INTRAVENOUS

## 2018-03-18 MED ORDER — FENTANYL CITRATE (PF) 100 MCG/2ML IJ SOLN
50.0000 ug | Freq: Once | INTRAMUSCULAR | Status: AC
  Administered 2018-03-18: 50 ug via INTRAVENOUS
  Filled 2018-03-18: qty 2

## 2018-03-18 MED ORDER — ONDANSETRON HCL 4 MG/2ML IJ SOLN
4.0000 mg | Freq: Four times a day (QID) | INTRAMUSCULAR | Status: DC | PRN
  Administered 2018-03-21: 4 mg via INTRAVENOUS
  Filled 2018-03-18: qty 2

## 2018-03-18 MED ORDER — FENTANYL CITRATE (PF) 100 MCG/2ML IJ SOLN
25.0000 ug | INTRAMUSCULAR | Status: DC | PRN
  Administered 2018-03-18 – 2018-03-19 (×6): 50 ug via INTRAVENOUS
  Filled 2018-03-18 (×6): qty 2

## 2018-03-18 MED ORDER — HEPARIN BOLUS VIA INFUSION
4000.0000 [IU] | Freq: Once | INTRAVENOUS | Status: AC
  Administered 2018-03-18: 4000 [IU] via INTRAVENOUS
  Filled 2018-03-18: qty 4000

## 2018-03-18 MED ORDER — IOPAMIDOL (ISOVUE-370) INJECTION 76%
INTRAVENOUS | Status: AC
  Filled 2018-03-18: qty 125

## 2018-03-18 MED ORDER — IPRATROPIUM-ALBUTEROL 0.5-2.5 (3) MG/3ML IN SOLN
3.0000 mL | Freq: Three times a day (TID) | RESPIRATORY_TRACT | Status: DC
  Administered 2018-03-18 – 2018-03-20 (×6): 3 mL via RESPIRATORY_TRACT
  Filled 2018-03-18 (×6): qty 3

## 2018-03-18 MED ORDER — ALBUTEROL SULFATE (2.5 MG/3ML) 0.083% IN NEBU: 2.5000 mg | INHALATION_SOLUTION | Freq: Four times a day (QID) | RESPIRATORY_TRACT | Status: DC | PRN

## 2018-03-18 MED ORDER — NITROGLYCERIN 0.4 MG SL SUBL: 0.4000 mg | SUBLINGUAL_TABLET | SUBLINGUAL | Status: DC | PRN

## 2018-03-18 MED ORDER — DILTIAZEM HCL 100 MG IV SOLR
5.0000 mg/h | Freq: Once | INTRAVENOUS | Status: AC
  Administered 2018-03-18: 5 mg/h via INTRAVENOUS
  Filled 2018-03-18: qty 100

## 2018-03-18 MED ORDER — DIPHENHYDRAMINE-ZINC ACETATE 2-0.1 % EX CREA
TOPICAL_CREAM | Freq: Three times a day (TID) | CUTANEOUS | Status: DC | PRN
  Administered 2018-03-19 – 2018-03-23 (×2): via TOPICAL
  Filled 2018-03-18 (×2): qty 28

## 2018-03-18 MED ORDER — IOPAMIDOL (ISOVUE-370) INJECTION 76%
100.0000 mL | Freq: Once | INTRAVENOUS | Status: AC | PRN
  Administered 2018-03-18: 100 mL via INTRAVENOUS

## 2018-03-18 MED ORDER — FENTANYL CITRATE (PF) 100 MCG/2ML IJ SOLN
25.0000 ug | Freq: Once | INTRAMUSCULAR | Status: AC
  Administered 2018-03-18: 25 ug via INTRAVENOUS
  Filled 2018-03-18: qty 2

## 2018-03-18 MED ORDER — SODIUM CHLORIDE 0.9 % IV BOLUS
500.0000 mL | Freq: Once | INTRAVENOUS | Status: AC
  Administered 2018-03-18: 500 mL via INTRAVENOUS

## 2018-03-18 MED ORDER — SODIUM CHLORIDE 0.9% FLUSH
3.0000 mL | INTRAVENOUS | Status: DC | PRN
  Administered 2018-03-21: 3 mL via INTRAVENOUS
  Filled 2018-03-18: qty 3

## 2018-03-18 MED ORDER — HEPARIN (PORCINE) IN NACL 100-0.45 UNIT/ML-% IJ SOLN
950.0000 [IU]/h | INTRAMUSCULAR | Status: DC
  Administered 2018-03-18 – 2018-03-19 (×2): 950 [IU]/h via INTRAVENOUS
  Filled 2018-03-18 (×3): qty 250

## 2018-03-18 NOTE — Progress Notes (Signed)
[facility] for heparin Indication: atrial fibrillation  Allergies  Allergen Reactions  . Atorvastatin Other (See Comments) and Hives    Hives.   . Diclofenac Sodium Nausea And Vomiting and Palpitations  . Hydrocodone Itching    Patient Measurements: Height: 5\' 6"  (167.6 cm) Weight: 150 lb (68 kg) IBW/kg (Calculated) : 59.3 Heparin Dosing Weight: 68 kg  Vital Signs: Temp: 98.2 F (36.8 C) (06/20 1107) Temp Source: Oral (06/20 1107) BP: 132/99 (06/20 2030) Pulse Rate: 75 (06/20 2030)  Labs: No results for input(s): HGB, HCT, PLT, APTT, LABPROT, INR, HEPARINUNFRC, HEPRLOWMOCWT, CREATININE, CKTOTAL, CKMB, TROPONINI in the last 72 hours.  CrCl cannot be calculated (Patient's most recent lab result is older than the maximum 21 days allowed.).   Medical History: Past Medical History:  Diagnosis Date  . Arterial occlusive disease   . Atherosclerotic heart disease   . Cancer ([place])    breast  . COPD (chronic obstructive pulmonary disease) ([place])   . Diverticulitis   . Gastroesophageal reflux disease   . Hyperlipemia   . Hypertension   . Hypothyroidism   . Paroxysmal A-fib ([place])   . PVD (peripheral vascular disease) [facility])     Assessment: 84 yo female with AFib. Planning to start heparin. Previous notes in the chart state that the patient is on Xarelto, but I am not sure if she is. I reviewed her MAR from [facility] and rehab; she has not received Xarelto at all in the month of June. Given the patient has been here for almost 12 hours, will go ahead and start heparin. I am ordering a INR to attempt to discern if she has been taking Xarelto. Pt is unsure. There are records that she was taking Xarelto in April 2019, but I have not found anything more recent.   Goal of Therapy:  Heparin level 0.3-0.7 units/ml Monitor platelets by anticoagulation protocol: Yes     Plan:  Heparin 4000 units x1 then 950 units/hr Daily HL, CBC First  level with AM labs   Harvel Quale 03/18/2018,9:08 PM

## 2018-03-18 NOTE — ED Notes (Signed)
Dr.Cain at bedside; pt awaiting placement for IV

## 2018-03-18 NOTE — ED Notes (Signed)
Pt crying in pain, reports increased pain and cramping to R foot and groin

## 2018-03-18 NOTE — ED Notes (Signed)
Pt taken to CT.

## 2018-03-18 NOTE — ED Triage Notes (Addendum)
[facility]- pt coming from [facility] with complaint of right leg pain and chest pain. Hx of arterial occlusion. Pt here to see vascular surgeon. Pt noted to be in afib with RVR on arrival. Pt denies chest pain at this time.

## 2018-03-18 NOTE — ED Notes (Signed)
CT contacted for pt's imaging. Pt is next in line

## 2018-03-18 NOTE — Progress Notes (Signed)
Patient oxygen saturation dropped into the 80's on room air.  2L nasal cannula applied, per patient sometimes at night does wear oxygen.

## 2018-03-18 NOTE — ED Notes (Addendum)
Pt noted to have BP of 89/57 after several checks, MD notified.

## 2018-03-18 NOTE — Consult Note (Addendum)
ED Consult    Reason for Consult: Right lower extremity pain Referring Physician: ED MRN #:  [id number]  History of Present Illness: This is a 84 y.o. female with history of bilateral common iliac artery stents in the left femoral-popliteal artery bypass and outside institution.  She is recently seen by my partner for bilateral lower extremity pain that was not thought to be vascular in nature.  She now has a 3-day history of right leg pain focused mostly in the right groin is exacerbated with any movement.  She presented to [facility] today for chest pain radiating to her back.  She is in chronic atrial fibrillation takes Xarelto but does not know the timing of her last dose.  She is also had coronary artery bypass grafting thinks that she takes Plavix as well.  She currently lives in a nursing home.  She is a former smoker but quit many years ago.  She has hyperlipidemia hypertension as of the risk factors.  Past Medical History:  Diagnosis Date  . Arterial occlusive disease   . Atherosclerotic heart disease   . Cancer ([place])    breast  . COPD (chronic obstructive pulmonary disease) ([place])   . Diverticulitis   . Gastroesophageal reflux disease   . Hyperlipemia   . Hypertension   . Hypothyroidism   . Paroxysmal A-fib ([place])   . PVD (peripheral vascular disease) ([place])     Past Surgical History:  Procedure Laterality Date  . BACK SURGERY    . BLADDER SURGERY    . CARDIAC SURGERY    . KNEE SURGERY Right   . MASTECTOMY Left   . ROTATOR CUFF REPAIR Left     Allergies  Allergen Reactions  . Atorvastatin Other (See Comments) and Hives    Hives.   . Diclofenac Sodium Nausea And Vomiting and Palpitations  . Hydrocodone Itching    Prior to Admission medications   Medication Sig Start Date End Date Taking? Authorizing Provider  acetaminophen (TYLENOL) 650 MG CR tablet Take by mouth.    [provider]  albuterol (PROVENTIL HFA) 108 (90 Base) MCG/ACT inhaler Inhale  into the lungs. 10/20/11   [provider]  amiodarone (PACERONE) 200 MG tablet Take 200 mg by mouth daily.    [provider]  azelastine (ASTELIN) 0.1 % nasal spray Place into the nose.    [provider]  Calcium Carb-Ergocalciferol 500-200 MG-UNIT TABS Take by mouth.    [provider]  Cholecalciferol (VITAMIN D-1000 MAX ST) 1000 units tablet Take by mouth.    [provider]  DULoxetine (CYMBALTA) 30 MG capsule TAKE 1 CAPSULE BY MOUTH ONCE DAILY 09/18/17   [provider]  furosemide (LASIX) 20 MG tablet Take by mouth. 06/07/15   [provider]  gabapentin (NEURONTIN) 300 MG capsule Take by mouth.    [provider]  metoprolol tartrate (LOPRESSOR) 25 MG tablet Take by mouth. 12/07/17   [provider]  nitroGLYCERIN (NITROSTAT) 0.4 MG SL tablet Place under the tongue. 04/29/10   [provider]  pravastatin (PRAVACHOL) 40 MG tablet Take by mouth. 12/14/15   [provider]  rivaroxaban (XARELTO) 10 MG TABS tablet Take by mouth.    [provider]  tamoxifen (NOLVADEX) 20 MG tablet TAKE ONE TABLET BY MOUTH ONCE DAILY 04/21/16   [provider]    Social History   Socioeconomic History  . Marital status: Married    Spouse name: Not on file  . Number of  children: Not on file  . Years of education: Not on file  . Highest education level: Not on file  Occupational History  . Not on file  Social Needs  . Financial resource strain: Not on file  . Food insecurity:    Worry: Not on file    Inability: Not on file  . Transportation needs:    Medical: Not on file    Non-medical: Not on file  Tobacco Use  . Smoking status: Former Smoker    Types: Cigarettes    Last attempt to quit: 03/09/1994    Years since quitting: 24.0  . Smokeless tobacco: Never Used  Substance and Sexual Activity  . Alcohol use: Never    Frequency: Never  . Drug use: Never  . Sexual activity: Not on  file  Lifestyle  . Physical activity:    Days per week: Not on file    Minutes per session: Not on file  . Stress: Not on file  Relationships  . Social connections:    Talks on phone: Not on file    Gets together: Not on file    Attends religious service: Not on file    Active member of club or organization: Not on file    Attends meetings of clubs or organizations: Not on file    Relationship status: Not on file  . Intimate partner violence:    Fear of current or ex partner: Not on file    Emotionally abused: Not on file    Physically abused: Not on file    Forced sexual activity: Not on file  Other Topics Concern  . Not on file  Social History Narrative  . Not on file     History reviewed. No pertinent family history.  ROS: [x] Positive   [ ] Negative   [ ] All sytems reviewed and are negative  Cardiovascular: [x] chest pain/pressure [] palpitations [] SOB lying flat [] DOE [] pain in legs while walking [x] pain in legs at rest [x] pain in legs at night [] non-healing ulcers [x] hx of DVT [] swelling in legs  Pulmonary: [] productive cough [] asthma/wheezing [] home O2  Neurologic: [] weakness in [] arms [] legs [] numbness in [] arms [] legs [] hx of CVA [] mini stroke []difficulty speaking or slurred speech [] temporary loss of vision in one eye [] dizziness  Hematologic: [x] hx of cancer [] bleeding problems [] problems with blood clotting easily  Endocrine:   [] diabetes [] thyroid disease  GI [] vomiting blood [] blood in stool  GU: [] CKD/renal failure [] HD--[] M/W/F or [] T/T/S [] burning with urination [] blood in urine  Psychiatric: [] anxiety [] depression  Musculoskeletal: [] arthritis [] joint pain  Integumentary: [] rashes [] ulcers  Constitutional: [] fever [] chills   Physical Examination  Vitals:   03/18/18 1145 03/18/18 1215  BP: 128/67 101/60  Pulse: (!) 140 (!) 137  Resp: 15 14  Temp:    SpO2: 95% 99%    There is no height or weight on file to calculate BMI.  General:  nad HENT: WNL, normocephalic Pulmonary: normal non-labored breathing Cardiac: Palpable carotid pulses, 2+ right radial pulse with 1+ left radial pulse I cannot palpate femoral pulses bilaterally there are monophasic signals by Doppler There are monophasic dorsalis pedis and posterior tibial signals by Doppler Abdomen:  soft, NT/ND, no masses Extremities: Her feet do not appear ischemic and they are both  warm Musculoskeletal: no muscle wasting or atrophy  Neurologic: A&O X 3; Appropriate Affect ; SENSATION: normal; MOTOR FUNCTION:  She has limited movement of right leg 2/2 right groin pain  CBC    Component Value Date/Time   WBC 5.8 08/03/2008 0941   RBC 4.03 08/03/2008 0941   HGB 14.3 12/03/2009 1252   HCT 42.0 12/03/2009 1252   PLT 183 08/03/2008 0941   MCV 96.5 08/03/2008 0941   MCHC 34.3 08/03/2008 0941   RDW 13.0 08/03/2008 0941   LYMPHSABS 2.2 08/03/2008 0941   MONOABS 0.4 08/03/2008 0941   EOSABS 0.1 08/03/2008 0941   BASOSABS 0.0 08/03/2008 0941    BMET    Component Value Date/Time   NA 138 12/03/2009 1252   K 4.0 12/03/2009 1252   CL 105 12/03/2009 1252   CO2 29 08/03/2008 0941   GLUCOSE 96 12/03/2009 1252   BUN 9 12/03/2009 1252   CREATININE 0.7 12/03/2009 1252   CALCIUM 9.2 08/04/2008 0600   GFRNONAA >60 08/03/2008 0941   GFRAA  08/03/2008 0941    >60        The eGFR has been calculated using the MDRD equation. This calculation has not been validated in all clinical    COAGS: Lab Results  Component Value Date   INR 0.9 08/03/2008     CT angios has been ordered and is pending   ASSESSMENT/PLAN: This is a 84 y.o. female presents with chest pain radiating to her back and has a 3-day history of right leg pain focused mostly in the right groin exacerbated with any movement.  She is on Xarelto unknown last dose states that she takes Plavix daily.  Legs do not appear ischemic at this  time but given the constellation of symptoms we will proceed with CT angio to evaluate.  Please keep patient n.p.o.  Erlene Quan C. Donzetta Matters, MD [facility]ists of [place] Office: [phone] Pager: [phone]  Addendum:  I have reviewed CT images and read. Vascular findings appear chronic. Unsure of etiology of patients pain. Will continue to follow and document formal abis tomorrow.   Brandon C. Donzetta Matters

## 2018-03-18 NOTE — ED Notes (Signed)
IV team at bedside; limited restriction on L side due to mastectomy; Dr.Rees at bedside speaking with family about consent to use L arm for emergent need access for CT scans

## 2018-03-18 NOTE — ED Notes (Signed)
Pt given turkey sandwich and sprite zero 

## 2018-03-18 NOTE — ED Notes (Signed)
CT contacted for transport with new IV access to R upper arm, Ct to grab pt next

## 2018-03-18 NOTE — H&P (Signed)
History and Physical    Chea M. Hogle JJK:[id number] DOB: 08/30/1931 DOA: 03/18/2018  PCP: Dierdre Searles, MD   Patient coming from: Nursing facility, by way of [facility] ED   Chief Complaint: Severe right leg pain, chest pain   HPI: Rita Holmes is a 84 y.o. female with medical history significant for COPD, coronary artery disease status post CABG, peripheral arterial disease status post bilateral iliac stents and left femoral-popliteal bypass, hypothyroidism, and chronic systolic CHF, now presenting to the emergency department for evaluation of severe right leg pain.  Patient reports severe pain throughout the entire right leg over the past 3 days, most severe proximally and worse with any movement.  She also complains of chest pain that began yesterday evening and is associated with mild shortness of breath and nausea.  She describes the pain as severe, radiating through to her back, constant, and without any alleviating or exacerbating factors.  She denies fevers, chills, headache, or focal numbness or weakness.    ED Course: Patient initially presented to [facility] emergency department where there was concern for acute ischemia involving the right leg, vascular surgery here at [facility] was consulted, and ED-ED transfer was undertaken.  Upon arrival to the [facility] ED, patient is found to be afebrile, saturating low 90s on room air, mildly tachypneic, tachycardic in the 130s, and with blood pressure 95/58.  EKG features atrial fibrillation with rate 134 and right bundle branch block.  CTA chest is negative for dissection, aneurysm, or other acute cardiopulmonary disease, but notable for.  CT angio aorta-bifemoral was performed and notable for patent bilateral iliac stents, chronic occlusion of the right SFA and proximal popliteal arteries, chronically occluded right SFA stent, patent left femoral-popliteal bypass graft, and suspected high-grade stenosis at the SMA.  Vascular  surgery evaluated patient in the emergency department and recommends keeping her n.p.o.  She was then normalized with treated with Lopressor IV push, resulting in hypotension 1.5 L normal saline bolus.  She remained in atrial fibrillation with RVR and was started on diltiazem infusion.  She will be admitted to the stepdown unit for ongoing evaluation and management.  Review of Systems:  All other systems reviewed and apart from HPI, are negative.  Past Medical History:  Diagnosis Date  . Arterial occlusive disease   . Atherosclerotic heart disease   . Cancer ([place])    breast  . COPD (chronic obstructive pulmonary disease) ([place])   . Diverticulitis   . Gastroesophageal reflux disease   . Hyperlipemia   . Hypertension   . Hypothyroidism   . Paroxysmal A-fib ([place])   . PVD (peripheral vascular disease) ([place])     Past Surgical History:  Procedure Laterality Date  . BACK SURGERY    . BLADDER SURGERY    . CARDIAC SURGERY    . KNEE SURGERY Right   . MASTECTOMY Left   . ROTATOR CUFF REPAIR Left      reports that she quit smoking about 24 years ago. Her smoking use included cigarettes. She has never used smokeless tobacco. She reports that she does not drink alcohol or use drugs.  Allergies  Allergen Reactions  . Atorvastatin Other (See Comments) and Hives    Hives.   . Diclofenac Sodium Nausea And Vomiting and Palpitations  . Hydrocodone Itching    Family History  Problem Relation Age of Onset  . Transient ischemic attack Mother      Prior to Admission medications   Medication Sig Start Date  End Date Taking? Authorizing Provider  acetaminophen (TYLENOL) 325 MG tablet Take 650 mg by mouth 3 (three) times daily.   Yes [provider]  albuterol (PROVENTIL HFA) 108 (90 Base) MCG/ACT inhaler Inhale 2 puffs into the lungs every 6 (six) hours as needed for wheezing or shortness of breath.  10/20/11  Yes [provider]  Calcium Carb-Ergocalciferol 500-200 MG-UNIT  TABS Take by mouth.   Yes [provider]  clopidogrel (PLAVIX) 75 MG tablet Take 75 mg by mouth daily.   Yes [provider]  diphenhydrAMINE (BENADRYL) 25 MG tablet Take 25 mg by mouth every 8 (eight) hours as needed for itching.   Yes [provider]  docusate sodium (COLACE) 100 MG capsule Take 200 mg by mouth daily.   Yes [provider]  fluticasone (FLONASE) 50 MCG/ACT nasal spray Place 2 sprays into both nostrils daily.   Yes [provider]  fluticasone furoate-vilanterol (BREO ELLIPTA) 100-25 MCG/INH AEPB Inhale 1 puff into the lungs daily.   Yes [provider]  furosemide (LASIX) 20 MG tablet Take 20 mg by mouth 2 (two) times daily.  06/07/15  Yes [provider]  gabapentin (NEURONTIN) 100 MG capsule Take 100 mg by mouth 3 (three) times daily.    Yes [provider]  ipratropium-albuterol (DUONEB) 0.5-2.5 (3) MG/3ML SOLN Take 3 mLs by nebulization 3 (three) times daily.   Yes [provider]  levothyroxine (SYNTHROID, LEVOTHROID) 125 MCG tablet Take 125 mcg by mouth daily before breakfast.   Yes [provider]  lisinopril (PRINIVIL,ZESTRIL) 5 MG tablet Take 5 mg by mouth daily.   Yes [provider]  metoprolol tartrate (LOPRESSOR) 25 MG tablet Take 25 mg by mouth 2 (two) times daily.  12/07/17  Yes [provider]  nitroGLYCERIN (NITROSTAT) 0.4 MG SL tablet Place 0.4 mg under the tongue every 5 (five) minutes as needed for chest pain.  04/29/10  Yes [provider]  Omega-3 Fatty Acids (FISH OIL) 1000 MG CAPS Take 1,000 mg by mouth at bedtime.   Yes [provider]  polyethylene glycol (MIRALAX / GLYCOLAX) packet Take 17 g by mouth daily as needed.   Yes [provider]  potassium chloride (K-DUR) 10 MEQ tablet Take 10 mEq by mouth daily.   Yes [provider]  pravastatin (PRAVACHOL) 40 MG tablet Take 40 mg by mouth daily.  12/14/15  Yes [provider]  tamoxifen (NOLVADEX) 20 MG tablet TAKE ONE TABLET BY MOUTH ONCE DAILY 04/21/16  Yes [provider]  traMADol (ULTRAM) 50 MG tablet Take 50 mg by mouth 3 (three) times daily.   Yes [provider]  traZODone (DESYREL) 50 MG tablet Take 25 mg by mouth at bedtime.   Yes [provider]  vitamin B-12 (CYANOCOBALAMIN) 500 MCG tablet Take 1,000 mcg by mouth at bedtime.   Yes [provider]    Physical Exam: Vitals:   03/18/18 1900 03/18/18 1930 03/18/18 2030 03/18/18 2100  BP: (!) 78/54 (!) 95/58 (!) 132/99   Pulse: (!) 139 (!) 132 75   Resp: (!) 24 (!) 28 (!) 26   Temp:      TempSrc:      SpO2: 93% 95% 94%   Weight:    68 kg (150 lb)  Height:    5\' 6"  (1.676 m)      Constitutional: NAD, appears uncomfortable Eyes: PERTLA, lids and conjunctivae normal ENMT: Mucous membranes are moist. Posterior pharynx clear of any  exudate or lesions.   Neck: normal, supple, no masses, no thyromegaly Respiratory: Breath sounds diminished bilaterally, no wheezing, no crackles. Normal respiratory effort.   Cardiovascular: Rate ~120 and irregular. No significant JVD. Abdomen: No distension, no tenderness, soft. Bowel sounds normal.  Musculoskeletal: no clubbing / cyanosis. No joint deformity upper and lower extremities.   Skin: no significant rashes, lesions, ulcers. Warm, dry, well-perfused. Neurologic: No facial asymmetry. Moving all extremities.  Psychiatric: Alert and oriented to person, place, and situation. Pleasant and cooperative.     Labs on Admission: I have personally reviewed following labs and imaging studies  CBC: No results for input(s): WBC, NEUTROABS, HGB, HCT, MCV, PLT in the last 168 hours. Basic Metabolic Panel: No results for input(s): NA, K, CL, CO2, GLUCOSE, BUN, CREATININE, CALCIUM, MG, PHOS in the last 168 hours. GFR: CrCl cannot be calculated (Patient's most recent lab result is older than the maximum 21 days  allowed.). Liver Function Tests: No results for input(s): AST, ALT, ALKPHOS, BILITOT, PROT, ALBUMIN in the last 168 hours. No results for input(s): LIPASE, AMYLASE in the last 168 hours. No results for input(s): AMMONIA in the last 168 hours. Coagulation Profile: No results for input(s): INR, PROTIME in the last 168 hours. Cardiac Enzymes: No results for input(s): CKTOTAL, CKMB, CKMBINDEX, TROPONINI in the last 168 hours. BNP (last 3 results) No results for input(s): PROBNP in the last 8760 hours. HbA1C: No results for input(s): HGBA1C in the last 72 hours. CBG: No results for input(s): GLUCAP in the last 168 hours. Lipid Profile: No results for input(s): CHOL, HDL, LDLCALC, TRIG, CHOLHDL, LDLDIRECT in the last 72 hours. Thyroid Function Tests: No results for input(s): TSH, T4TOTAL, FREET4, T3FREE, THYROIDAB in the last 72 hours. Anemia Panel: No results for input(s): VITAMINB12, FOLATE, FERRITIN, TIBC, IRON, RETICCTPCT in the last 72 hours. Urine analysis:    Component Value Date/Time   COLORURINE YELLOW 08/03/2008 1128   APPEARANCEUR CLEAR 08/03/2008 1128   LABSPEC 1.007 08/03/2008 1128   PHURINE 7.5 08/03/2008 1128   GLUCOSEU NEGATIVE 08/03/2008 1128   HGBUR NEGATIVE 08/03/2008 1128   BILIRUBINUR NEGATIVE 08/03/2008 1128   KETONESUR NEGATIVE 08/03/2008 1128   PROTEINUR NEGATIVE 08/03/2008 1128   UROBILINOGEN 0.2 08/03/2008 1128   NITRITE NEGATIVE 08/03/2008 1128   LEUKOCYTESUR  08/03/2008 1128    NEGATIVE MICROSCOPIC NOT DONE ON URINES WITH NEGATIVE PROTEIN, BLOOD, LEUKOCYTES, NITRITE, OR GLUCOSE <1000 mg/dL.   Sepsis Labs: @LABRCNTIP (procalcitonin:4,lacticidven:4) )No results found for this or any previous visit (from the past 240 hour(s)).   Radiological Exams on Admission: Ct Angio Aortobifemoral W And/or Wo Contrast  Result Date: 03/18/2018 CLINICAL DATA:  Right leg pain and chest pain history of arterial occlusion EXAM: CT ANGIOGRAPHY OF ABDOMINAL AORTA WITH  ILIOFEMORAL RUNOFF TECHNIQUE: Multidetector CT imaging of the abdomen, pelvis and lower extremities was performed using the standard protocol during bolus administration of intravenous contrast. Multiplanar CT image reconstructions and MIPs were obtained to evaluate the vascular anatomy. CONTRAST:  100 mL Isovue 370 intravenous COMPARISON:  CTA runoff 07/07/2016, CT abdomen pelvis 10/25/2017, 06/18/2017 FINDINGS: VASCULAR Aorta: Moderate severe aortic atherosclerosis with diffuse mural thrombus. Infrarenal abdominal aortic aneurysm measuring 4 cm maximum dimension over a craniocaudad span of 5.3 cm. Extensive mural thrombus in the aneurysm sac. No dissection seen. Celiac: Heavily calcified at the origin with at least moderate stenosis suspected. Heavily calcified splenic artery without aneurysm formation. SMA: Heavily calcified at the origin. Severe stenosis at the origin with moderate diffuse disease of SMA distal  branches with flow enhancement present distally. Renals: Heavily calcified at the origin with moderate to severe stenosis suspected. S IMA: Patent but also heavily calcified at the origin. RIGHT Lower Extremity Inflow: Bilateral iliac stents. Right limb of the stent is patent. Mild to moderate calcific disease of the external iliac artery on the right but with patency. Heavily calcified origin of right internal iliac artery with moderate severe diffuse disease present. Outflow: Moderate calcification of the right common femoral artery. Chronic occlusion of the right SFA just past the origin with occluded SFA stent as before. Deep femoral arterial branches are patent. Limited evaluation of popliteal artery due to streak artifact from metallic hardware. Heavily calcified popliteal artery. Runoff: Difficult due to diffuse calcific disease. Primary runoff via the anterior tibial artery which demonstrates diffuse disease. Diffusely diseased posterior tibial artery. Distal posterior tibial artery appears  occluded just above the ankle. Reconstitution of the distal peroneal artery via small collateral vessels. Flow enhancement visible within the dorsalis pedis artery. LEFT Lower Extremity Inflow: Bilateral iliac stent. Narrowed appearance of the left limb but with patency noted. Mild disease of the external iliac artery but with patency. Heavily calcified left internal iliac artery. Outflow: Deep femoral artery is patent with calcific disease of distal branches. Chronic occlusion of the native superficial femoral artery just be on the origin. Left femoropopliteal bypass graft to above the knee with mural disease and narrowing present throughout the graft but with flow enhancement/patency established. Moderate calcific disease of the native popliteal artery with moderate stenosis. Runoff: Diseased runoff via the anterior tibial artery with flow enhancement in the dorsal pedalis. Diminutive posterior tibial artery with diffuse disease. Diminutive peroneal which occludes in the calf. Review of the MIP images confirms the above findings. NON-VASCULAR Lower chest: Nonacute. Hepatobiliary: No focal liver abnormality is seen. No gallstones, gallbladder wall thickening, or biliary dilatation. Pancreas: Unremarkable. No pancreatic ductal dilatation or surrounding inflammatory changes. Spleen: Normal in size without focal abnormality. Adrenals/Urinary Tract: Adrenal glands are unremarkable. Kidneys are normal, without renal calculi, focal lesion, or hydronephrosis. Bladder is unremarkable. Stomach/Bowel: Stomach is nonenlarged. No dilated small bowel. Colon shows no focal wall thickening. Sigmoid colon diverticular disease Lymphatic: No significantly enlarged lymph nodes Reproductive: Prominent uterus for age, no adnexal mass. Other: Negative for free air or free fluid. Small fat in the umbilical region Musculoskeletal: Post sternotomy changes. Posterior spinal rods and fixating screws L1 through L5. 2 cm right popliteal fossa  cyst. Mild diffuse atrophy of the left calf muscles. Subcutaneous edema within the left lower extremity at the ankle and foot. No acute osseous abnormality is seen. Metallic hardware in the right knee from replacement. IMPRESSION: VASCULAR 1. Infrarenal abdominal aortic aneurysm measuring up to 4 cm. Recommend followup by ultrasound in 1 year. This recommendation follows ACR consensus guidelines: White Paper of the ACR Incidental Findings Committee II on Vascular Findings. J Am Coll Radiol 2013; 10:789-794. No acute aortic dissection is seen. 2. Status post bilateral iliac artery stents with stent patency. 3. 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 Chronically occluded right SFA stent. Diseased runoff, primarily via the right anterior tibial artery with reconstitution of the distal popliteal artery as before. 4. Status post left femoropopliteal bypass graft with diseased but patent graft. 5. Heavy calcific disease of major branch vessels within the abdomen with suspected high-grade stenosis at the origin of the SMA. NON-VASCULAR 1. No CT  evidence for acute intra-abdominal or pelvic abnormality. 2. Sigmoid colon diverticular disease without acute inflammation 3. 2 cm right popliteal fossa cyst Electronically Signed   By: Donavan Foil M.D.   On: 03/18/2018 20:12   Ct Angio Chest Aorta W/cm &/or Wo/cm  Result Date: 03/18/2018 CLINICAL DATA:  Chest pain EXAM: CT ANGIOGRAPHY CHEST WITH CONTRAST TECHNIQUE: Multidetector CT imaging of the chest was performed using the standard protocol during bolus administration of intravenous contrast. Multiplanar CT image reconstructions and MIPs were obtained to evaluate the vascular anatomy. CONTRAST:  100 mL Isovue 370 intravenous COMPARISON:  Chest x-ray 03/18/2018, PET-CT 05/21/2017, CT a chest 03/09/2017 FINDINGS: Cardiovascular: Nonaneurysmal aorta.  No dissection seen. Go not tailored for pulmonary embolus study, no gross central filling defects are seen. Moderate aortic atherosclerosis. Left-sided arch with 3 vessel origin. Post CABG changes. Coronary vascular calcification. Heart size upper normal. No pericardial effusion. Mediastinum/Nodes: Midline trachea. No significant mediastinal adenopathy. Small distal esophageal hiatal hernia. Lungs/Pleura: Stable 4 mm right upper lobe pulmonary nodule. Mild emphysema. No acute consolidation or effusion. No pneumothorax. Upper Abdomen: No acute abnormality. Musculoskeletal: Post sternotomy changes. No acute or suspicious abnormality. Post left mastectomy. Review of the MIP images confirms the above findings. IMPRESSION: 1. Negative for aortic dissection or aneurysm. No gross central filling defects within the pulmonary arterial system. 2. Mild emphysema.  No acute consolidation or pleural effusion. Aortic Atherosclerosis (ICD10-I70.0) and Emphysema (ICD10-J43.9). Electronically Signed   By: Donavan Foil M.D.   On: 03/18/2018 19:02    EKG: Independently reviewed. Atrial fibrillation with RVR (rate 132).   Assessment/Plan  1. Atrial fibrillation with RVR  - Presents with chest pain and severe right leg pain, found to be in atrial fibrillation with rate 130's  - CHADS-VASc at least 4 (age x2, vascular dz, CHF)  - Treated with Lopressor IVP in ED with resulting hypotension and persistent RVR; BP normalized with fluid bolus and diltiazem infusion was started  - Continue cardiac monitoring, continue diltiazem infusion with titration for goal rate 60-105  - Started on IV heparin infusion    2. Chest pain; elevated troponin; hx CAD  - Reported chest pain on arrival to ED, improved with analgesia  - CTA negative for dissection or PE; no other acute cardiopulmonary disease noted  - EKG with AF, rate 134  - Troponin was elevated to 0.17 at [facility] ED, prior to transfer here where repeat troponin is 0.16  -  She reports allergy to ASA  - Treated with Lopressor IVP in ED that resulted in hypotension  - Hold Xarelto for possible surgery, start IV heparin infusion, continue cardiac monitoring, trend troponin   3. Severe right leg pain; PAD  - Presents with 3 days of severe pain in right leg  - She has PAD s/p bilateral iliac stents and left fem-pop bypass  - Vascular surgery consulting and much appreciated  - Keep NPO for now, follow-up vascular recommendations   4. Chronic systolic CHF  - Appears well-compensated  - Treated with 1.5 liters NS in ED  - Follow daily wt and I/O's, SLIV for now    5. COPD  - No wheezing or SOB  - Continue ICS/LABA and Duoneb   6. Hypothyroidism  - Normal T4 in March  - Continue Synthroid    7. AAA  - 4 cm infrarenal AAA noted on CTA - Follow-up US recommended in 1 year    DVT prophylaxis: IV heparin infusion; Xarelto pta  Code Status: Full  Family  Communication: Discussed with patient Consults called: Vascular surgery Admission status: Inpatient    Vianne Bulls, MD [facility] Pager [phone]  If 7PM-7AM, please contact night-coverage [email or web address] Password [facility]  03/18/2018, 9:16 PM

## 2018-03-18 NOTE — ED Provider Notes (Signed)
Patient care assumed at 1615. Pt with history of atrial fibrillation on anticoagulation here for evaluation of chest pain, abdominal pain, leg pain. She is in a fin with RVR with soft blood pressures. CTA pending at time of handoff. CTA obtained with no evidence of acute arterial occlusion or dissection. Discussed with Dr. Donzetta Matters with Vascular Surgery. Plan to admit for further evaluation and workup. She was started on Cardizem drip for her rapid atrial fibrillation.  Hospitalist consulted for admission.   Labs from [facility] obtained this morning demonstrate sodium 135, potassium 4.1, bicarb 25, glucose 92, BUN 17, creatinine .7, troponin T .17, white blood cell 8.2, hemoglobin 10.7, platelet 186.  CRITICAL CARE Performed by: Quintella Reichert   Total critical care time: 35 minutes  Critical care time was exclusive of separately billable procedures and treating other patients.  Critical care was necessary to treat or prevent imminent or life-threatening deterioration.  Critical care was time spent personally by me on the following activities: development of treatment plan with patient and/or surrogate as well as nursing, discussions with consultants, evaluation of patient's response to treatment, examination of patient, obtaining history from patient or surrogate, ordering and performing treatments and interventions, ordering and review of laboratory studies, ordering and review of radiographic studies, pulse oximetry and re-evaluation of patient's condition.    Quintella Reichert, MD 03/19/18 [id number]

## 2018-03-18 NOTE — ED Provider Notes (Signed)
[place] EMERGENCY DEPARTMENT Provider Note   CSN: [id number] Arrival date & time: 03/18/18  1106     History   Chief Complaint Chief Complaint  Patient presents with  . Leg Pain    HPI Rita Holmes is a 84 y.o. female.  She is on anticoagulation for chronic A. fib.  She is transferred here from [facility] in [place] emergency department for evaluation of right leg pain from her hip down her entire leg is been going on for 3 days.  She said it acutely worsened since last night and was unable to sleep.  She rated the pain as 10 out of 10.  She also was experiencing some substernal chest pain and shortness of breath that is been going on since 6 PM last night.  Patient was evaluated at as per and was concerned for no pulse in that leg and transferred here after discussion with Dr. Donzetta Matters from vascular surgery.  She states both her leg pain in her chest pain are improved after pain medicine they gave her an outside hospital.  She does not quantify that pain now.  She states she has had multiple cardiac procedures and also had a vascular procedure in her leg on the left.  They also told her she would not be able to get any further procedures because of her cardiac disease.  She is also complaining of vomiting after meals for the last couple of days.  No fevers no chills.  There is no numbness or weakness in the leg.  The history is provided by the patient and medical records.  Leg Pain   This is a new problem. Episode onset: 3 days. The problem occurs constantly. The problem has been rapidly worsening. The pain is present in the right upper leg, right lower leg and right foot. The pain is at a severity of 10/10. Pertinent negatives include no numbness. She has tried nothing for the symptoms. There has been no history of extremity trauma.  Chest Pain   This is a recurrent problem. The current episode started yesterday. The problem occurs constantly. The pain is present  in the substernal region. The pain radiates to the left shoulder and left arm. Associated symptoms include leg pain, nausea and shortness of breath. Pertinent negatives include no abdominal pain, no back pain, no cough, no diaphoresis, no fever, no numbness and no syncope.  Her past medical history is significant for arrhythmia, CAD and cancer.  Pertinent negatives for past medical history include no seizures.    Past Medical History:  Diagnosis Date  . Arterial occlusive disease   . Atherosclerotic heart disease   . Cancer ([place])    breast  . COPD (chronic obstructive pulmonary disease) ([place])   . Diverticulitis   . Gastroesophageal reflux disease   . Hyperlipemia   . Hypertension   . Hypothyroidism   . Paroxysmal A-fib ([place])   . PVD (peripheral vascular disease) ([place])     There are no active problems to display for this patient.   Past Surgical History:  Procedure Laterality Date  . BACK SURGERY    . BLADDER SURGERY    . CARDIAC SURGERY    . KNEE SURGERY Right   . MASTECTOMY Left   . ROTATOR CUFF REPAIR Left      OB History   None      Home Medications    Prior to Admission medications   Medication Sig Start Date End Date Taking? Authorizing Provider  acetaminophen (TYLENOL) 650 MG CR tablet Take by mouth.    [provider]  albuterol (PROVENTIL HFA) 108 (90 Base) MCG/ACT inhaler Inhale into the lungs. 10/20/11   [provider]  amiodarone (PACERONE) 200 MG tablet Take 200 mg by mouth daily.    [provider]  azelastine (ASTELIN) 0.1 % nasal spray Place into the nose.    [provider]  Calcium Carb-Ergocalciferol 500-200 MG-UNIT TABS Take by mouth.    [provider]  Cholecalciferol (VITAMIN D-1000 MAX ST) 1000 units tablet Take by mouth.    [provider]  DULoxetine (CYMBALTA) 30 MG capsule TAKE 1 CAPSULE BY MOUTH ONCE DAILY 09/18/17   [provider]  furosemide (LASIX) 20 MG tablet Take by  mouth. 06/07/15   [provider]  gabapentin (NEURONTIN) 300 MG capsule Take by mouth.    [provider]  metoprolol tartrate (LOPRESSOR) 25 MG tablet Take by mouth. 12/07/17   [provider]  nitroGLYCERIN (NITROSTAT) 0.4 MG SL tablet Place under the tongue. 04/29/10   [provider]  pravastatin (PRAVACHOL) 40 MG tablet Take by mouth. 12/14/15   [provider]  rivaroxaban (XARELTO) 10 MG TABS tablet Take by mouth.    [provider]  tamoxifen (NOLVADEX) 20 MG tablet TAKE ONE TABLET BY MOUTH ONCE DAILY 04/21/16   [provider]    Family History History reviewed. No pertinent family history.  Social History Social History   Tobacco Use  . Smoking status: Former Smoker    Types: Cigarettes    Last attempt to quit: 03/09/1994    Years since quitting: 24.0  . Smokeless tobacco: Never Used  Substance Use Topics  . Alcohol use: Never    Frequency: Never  . Drug use: Never     Allergies   Atorvastatin; Diclofenac sodium; and Hydrocodone   Review of Systems Review of Systems  Constitutional: Negative for diaphoresis and fever.  HENT: Negative for rhinorrhea and sore throat.   Eyes: Negative for visual disturbance.  Respiratory: Positive for shortness of breath. Negative for cough.   Cardiovascular: Positive for chest pain. Negative for syncope.  Gastrointestinal: Positive for nausea. Negative for abdominal pain.  Genitourinary: Negative for dysuria and hematuria.  Musculoskeletal: Negative for back pain and neck pain.  Skin: Negative for rash.  Neurological: Negative for seizures and numbness.     Physical Exam Updated Vital Signs BP (!) 110/51 (BP Location: Right Arm)   Pulse (!) 131   Temp 98.2 F (36.8 C) (Oral)   Resp 18   SpO2 97%   Physical Exam  Constitutional: She appears well-developed and well-nourished. No distress.  HENT:  Head: Normocephalic and atraumatic.  Right Ear: External ear  normal.  Left Ear: External ear normal.  Nose: Nose normal.  Mouth/Throat: Oropharynx is clear and moist.  Eyes: Conjunctivae are normal.  Neck: Normal range of motion. Neck supple.  Cardiovascular: An irregularly irregular rhythm present. Tachycardia present.  Murmur heard. Pulses:      Femoral pulses are 2+ on the right side.      Popliteal pulses are 0 on the right side, and 0 on the left side.       Dorsalis pedis pulses are 0 on the right side, and 0 on the left side.       Posterior tibial pulses are 0 on the right side, and 0 on the left side.  Both legs are warm and pink.  Pulmonary/Chest: Effort normal and breath sounds  normal. No respiratory distress.  Abdominal: Soft. There is no tenderness.  Musculoskeletal: Normal range of motion. She exhibits no edema or deformity.  Neurological: She is alert.  Skin: Skin is warm and dry.  Psychiatric: She has a normal mood and affect.  Nursing note and vitals reviewed.    ED Treatments / Results  Labs (all labs ordered are listed, but only abnormal results are displayed) Labs Reviewed  CBC WITH DIFFERENTIAL/PLATELET - Abnormal; Notable for the following components:      Result Value   RBC 2.99 (*)    Hemoglobin 9.7 (*)    HCT 31.3 (*)    MCV 104.7 (*)    RDW 16.2 (*)    All other components within normal limits  COMPREHENSIVE METABOLIC PANEL - Abnormal; Notable for the following components:   CO2 21 (*)    Glucose, Bld 116 (*)    Calcium 7.7 (*)    Total Protein 5.1 (*)    Albumin 3.0 (*)    ALT 12 (*)    Alkaline Phosphatase 29 (*)    GFR calc non Af Amer 52 (*)    All other components within normal limits  TROPONIN I - Abnormal; Notable for the following components:   Troponin I 0.16 (*)    All other components within normal limits  TROPONIN I - Abnormal; Notable for the following components:   Troponin I 0.17 (*)    All other components within normal limits  PROTIME-INR - Abnormal; Notable for the following  components:   Prothrombin Time 15.6 (*)    All other components within normal limits  I-STAT TROPONIN, ED - Abnormal; Notable for the following components:   Troponin i, poc 0.16 (*)    All other components within normal limits  MRSA PCR SCREENING  HEPARIN LEVEL (UNFRACTIONATED)  TROPONIN I  BASIC METABOLIC PANEL  CBC  APTT  HEPARIN LEVEL (UNFRACTIONATED)  TYPE AND SCREEN  ABO/RH    EKG EKG Interpretation  Date/Time:  Thursday March 18 2018 11:08:34 EDT Ventricular Rate:  134 PR Interval:    QRS Duration: 141 QT Interval:  378 QTC Calculation: 565 R Axis:   -68 Text Interpretation:  Atrial fibrillation Right bundle branch block Inferior infarct, old Confirmed by Aletta Edouard [phone]) on 03/18/2018 12:01:20 PM   Radiology Ct Angio Aortobifemoral W And/or Wo Contrast  Result Date: 03/18/2018 CLINICAL DATA:  Right leg pain and chest pain history of arterial occlusion EXAM: CT ANGIOGRAPHY OF ABDOMINAL AORTA WITH ILIOFEMORAL RUNOFF TECHNIQUE: Multidetector CT imaging of the abdomen, pelvis and lower extremities was performed using the standard protocol during bolus administration of intravenous contrast. Multiplanar CT image reconstructions and MIPs were obtained to evaluate the vascular anatomy. CONTRAST:  100 mL Isovue 370 intravenous COMPARISON:  CTA runoff 07/07/2016, CT abdomen pelvis 10/25/2017, 06/18/2017 FINDINGS: VASCULAR Aorta: Moderate severe aortic atherosclerosis with diffuse mural thrombus. Infrarenal abdominal aortic aneurysm measuring 4 cm maximum dimension over a craniocaudad span of 5.3 cm. Extensive mural thrombus in the aneurysm sac. No dissection seen. Celiac: Heavily calcified at the origin with at least moderate stenosis suspected. Heavily calcified splenic artery without aneurysm formation. SMA: Heavily calcified at the origin. Severe stenosis at the origin with moderate diffuse disease of SMA distal branches with flow enhancement present distally. Renals: Heavily  calcified at the origin with moderate to severe stenosis suspected. S IMA: Patent but also heavily calcified at the origin. RIGHT Lower Extremity Inflow: Bilateral iliac stents. Right limb of the stent is patent. Mild  to moderate calcific disease of the external iliac artery on the right but with patency. Heavily calcified origin of right internal iliac artery with moderate severe diffuse disease present. Outflow: Moderate calcification of the right common femoral artery. Chronic occlusion of the right SFA just past the origin with occluded SFA stent as before. Deep femoral arterial branches are patent. Limited evaluation of popliteal artery due to streak artifact from metallic hardware. Heavily calcified popliteal artery. Runoff: Difficult due to diffuse calcific disease. Primary runoff via the anterior tibial artery which demonstrates diffuse disease. Diffusely diseased posterior tibial artery. Distal posterior tibial artery appears occluded just above the ankle. Reconstitution of the distal peroneal artery via small collateral vessels. Flow enhancement visible within the dorsalis pedis artery. LEFT Lower Extremity Inflow: Bilateral iliac stent. Narrowed appearance of the left limb but with patency noted. Mild disease of the external iliac artery but with patency. Heavily calcified left internal iliac artery. Outflow: Deep femoral artery is patent with calcific disease of distal branches. Chronic occlusion of the native superficial femoral artery just be on the origin. Left femoropopliteal bypass graft to above the knee with mural disease and narrowing present throughout the graft but with flow enhancement/patency established. Moderate calcific disease of the native popliteal artery with moderate stenosis. Runoff: Diseased runoff via the anterior tibial artery with flow enhancement in the dorsal pedalis. Diminutive posterior tibial artery with diffuse disease. Diminutive peroneal which occludes in the calf. Review  of the MIP images confirms the above findings. NON-VASCULAR Lower chest: Nonacute. Hepatobiliary: No focal liver abnormality is seen. No gallstones, gallbladder wall thickening, or biliary dilatation. Pancreas: Unremarkable. No pancreatic ductal dilatation or surrounding inflammatory changes. Spleen: Normal in size without focal abnormality. Adrenals/Urinary Tract: Adrenal glands are unremarkable. Kidneys are normal, without renal calculi, focal lesion, or hydronephrosis. Bladder is unremarkable. Stomach/Bowel: Stomach is nonenlarged. No dilated small bowel. Colon shows no focal wall thickening. Sigmoid colon diverticular disease Lymphatic: No significantly enlarged lymph nodes Reproductive: Prominent uterus for age, no adnexal mass. Other: Negative for free air or free fluid. Small fat in the umbilical region Musculoskeletal: Post sternotomy changes. Posterior spinal rods and fixating screws L1 through L5. 2 cm right popliteal fossa cyst. Mild diffuse atrophy of the left calf muscles. Subcutaneous edema within the left lower extremity at the ankle and foot. No acute osseous abnormality is seen. Metallic hardware in the right knee from replacement. IMPRESSION: VASCULAR 1. Infrarenal abdominal aortic aneurysm measuring up to 4 cm. Recommend followup by ultrasound in 1 year. This recommendation follows ACR consensus guidelines: White Paper of the ACR Incidental Findings Committee II on Vascular Findings. J Am Coll Radiol 2013; 10:789-794. No acute aortic dissection is seen. 2. Status post bilateral iliac artery stents with stent patency. 3. 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 Chronically occluded right SFA stent. Diseased runoff, primarily via the right anterior tibial artery with reconstitution of the distal popliteal artery as before. 4. Status post left femoropopliteal bypass graft with  diseased but patent graft. 5. Heavy calcific disease of major branch vessels within the abdomen with suspected high-grade stenosis at the origin of the SMA. NON-VASCULAR 1. No CT evidence for acute intra-abdominal or pelvic abnormality. 2. Sigmoid colon diverticular disease without acute inflammation 3. 2 cm right popliteal fossa cyst Electronically Signed   By: Donavan Foil M.D.   On: 03/18/2018 20:12   Ct Angio Chest Aorta W/cm &/  or Wo/cm  Result Date: 03/18/2018 CLINICAL DATA:  Chest pain EXAM: CT ANGIOGRAPHY CHEST WITH CONTRAST TECHNIQUE: Multidetector CT imaging of the chest was performed using the standard protocol during bolus administration of intravenous contrast. Multiplanar CT image reconstructions and MIPs were obtained to evaluate the vascular anatomy. CONTRAST:  100 mL Isovue 370 intravenous COMPARISON:  Chest x-ray 03/18/2018, PET-CT 05/21/2017, CT a chest 03/09/2017 FINDINGS: Cardiovascular: Nonaneurysmal aorta. No dissection seen. Go not tailored for pulmonary embolus study, no gross central filling defects are seen. Moderate aortic atherosclerosis. Left-sided arch with 3 vessel origin. Post CABG changes. Coronary vascular calcification. Heart size upper normal. No pericardial effusion. Mediastinum/Nodes: Midline trachea. No significant mediastinal adenopathy. Small distal esophageal hiatal hernia. Lungs/Pleura: Stable 4 mm right upper lobe pulmonary nodule. Mild emphysema. No acute consolidation or effusion. No pneumothorax. Upper Abdomen: No acute abnormality. Musculoskeletal: Post sternotomy changes. No acute or suspicious abnormality. Post left mastectomy. Review of the MIP images confirms the above findings. IMPRESSION: 1. Negative for aortic dissection or aneurysm. No gross central filling defects within the pulmonary arterial system. 2. Mild emphysema.  No acute consolidation or pleural effusion. Aortic Atherosclerosis (ICD10-I70.0) and Emphysema (ICD10-J43.9). Electronically Signed    By: Donavan Foil M.D.   On: 03/18/2018 19:02    Procedures .Critical Care Performed by: Hayden Rasmussen, MD Authorized by: Hayden Rasmussen, MD   Critical care provider statement:    Critical care time (minutes):  30   Critical care was necessary to treat or prevent imminent or life-threatening deterioration of the following conditions:  Circulatory failure and cardiac failure   Critical care was time spent personally by me on the following activities:  Development of treatment plan with patient or surrogate, discussions with consultants, evaluation of patient's response to treatment, examination of patient, obtaining history from patient or surrogate, ordering and performing treatments and interventions, ordering and review of laboratory studies, ordering and review of radiographic studies, pulse oximetry, re-evaluation of patient's condition and review of old charts   (including critical care time)  Medications Ordered in ED Medications - No data to display   Initial Impression / Assessment and Plan / ED Course  I have reviewed the triage vital signs and the nursing notes.  Pertinent labs & imaging results that were available during my care of the patient were reviewed by me and considered in my medical decision making (see chart for details).  Clinical Course as of Mar 19 710  Thu Mar 18, 2018  1251 Dr. Donzetta Matters from vascular and to evaluate the patient.   [MB]  9242 Seen by Dr. Gwenlyn Saran and he is recommending getting a CT dissection protocol.   [MB]  35 Was going to give her a little bit of Lopressor for rate control as she did not take her morning meds today but now her heart rates more in the 80s.  She received some IV fluids with some improvement in her blood pressure.  She is due for second toe first was 0.17.  We are still waiting on her to get called for her CT dissection protocol.   [MB]  6834 First troponin here which is ultimately her second troponin is 0.16   [MB]  1552  Patient waiting on CT dissection protocol.  Once the scan is read would let Dr. Donzetta Matters know from Vascular.  If no acute intervention is required the patient would still need to be admitted to medical team for continued cardiac management of her A. fib and her elevated troponin.  Likely she would benefit from getting back on her oral medications that she did not get today.   [MB]    Clinical Course User Index [MB] Hayden Rasmussen, MD     Final Clinical Impressions(s) / ED Diagnoses   Final diagnoses:  Right leg pain  Chest pain, unspecified type  Elevated troponin  Atrial fibrillation with rapid ventricular response [facility])    ED Discharge Orders    None       Hayden Rasmussen, MD 03/19/18 [phone]

## 2018-03-19 ENCOUNTER — Inpatient Hospital Stay (HOSPITAL_COMMUNITY): Payer: Medicare Other

## 2018-03-19 ENCOUNTER — Encounter: Payer: Self-pay | Admitting: General Practice

## 2018-03-19 DIAGNOSIS — R52 Pain, unspecified: Secondary | ICD-10-CM

## 2018-03-19 DIAGNOSIS — I10 Essential (primary) hypertension: Secondary | ICD-10-CM

## 2018-03-19 DIAGNOSIS — I2510 Atherosclerotic heart disease of native coronary artery without angina pectoris: Secondary | ICD-10-CM

## 2018-03-19 DIAGNOSIS — I4891 Unspecified atrial fibrillation: Secondary | ICD-10-CM

## 2018-03-19 DIAGNOSIS — I739 Peripheral vascular disease, unspecified: Secondary | ICD-10-CM

## 2018-03-19 DIAGNOSIS — R748 Abnormal levels of other serum enzymes: Secondary | ICD-10-CM

## 2018-03-19 LAB — BASIC METABOLIC PANEL
ANION GAP: 11 (ref 5–15)
BUN: 12 mg/dL (ref 6–20)
CO2: 18 mmol/L — ABNORMAL LOW (ref 22–32)
Calcium: 8 mg/dL — ABNORMAL LOW (ref 8.9–10.3)
Chloride: 108 mmol/L (ref 101–111)
Creatinine, Ser: 0.92 mg/dL (ref 0.44–1.00)
GFR, EST NON AFRICAN AMERICAN: 55 mL/min — AB (ref >60)
Glucose, Bld: 96 mg/dL (ref 65–99)
POTASSIUM: 4.2 mmol/L (ref 3.5–5.1)
Sodium: 137 mmol/L (ref 135–145)

## 2018-03-19 LAB — HEPARIN LEVEL (UNFRACTIONATED)
Heparin Unfractionated: 0.48 IU/mL (ref 0.30–0.70)
Heparin Unfractionated: 0.39 IU/mL (ref 0.30–0.70)

## 2018-03-19 LAB — CBC
HCT: 31.9 % — ABNORMAL LOW (ref 36.0–46.0)
HEMOGLOBIN: 10 g/dL — AB (ref 12.0–15.0)
MCH: 32.2 pg (ref 26.0–34.0)
MCHC: 31.3 g/dL (ref 30.0–36.0)
MCV: 102.6 fL — ABNORMAL HIGH (ref 78.0–100.0)
Platelets: 176 10*3/uL (ref 150–400)
RBC: 3.11 MIL/uL — AB (ref 3.87–5.11)
RDW: 16.1 % — ABNORMAL HIGH (ref 11.5–15.5)
WBC: 10.2 10*3/uL (ref 4.0–10.5)

## 2018-03-19 LAB — APTT: APTT: 87 s — AB (ref 24–36)

## 2018-03-19 LAB — MRSA PCR SCREENING: MRSA BY PCR: NEGATIVE

## 2018-03-19 LAB — TROPONIN I
TROPONIN I: 0.17 ng/mL — AB (ref <0.03)
Troponin I: 0.14 ng/mL (ref <0.03)

## 2018-03-19 MED ORDER — TAMOXIFEN CITRATE 10 MG PO TABS
20.0000 mg | ORAL_TABLET | Freq: Every day | ORAL | Status: DC
  Administered 2018-03-19 – 2018-03-23 (×5): 20 mg via ORAL
  Filled 2018-03-19 (×5): qty 2

## 2018-03-19 MED ORDER — DILTIAZEM HCL 100 MG IV SOLR: 5.0000 mg/h | INTRAVENOUS | Status: DC

## 2018-03-19 MED ORDER — FENTANYL CITRATE (PF) 100 MCG/2ML IJ SOLN: 25.0000 ug | INTRAMUSCULAR | Status: DC | PRN

## 2018-03-19 MED ORDER — DIGOXIN 125 MCG PO TABS
0.1250 mg | ORAL_TABLET | Freq: Every day | ORAL | Status: DC
Start: 2018-03-19 — End: 2018-03-23
  Administered 2018-03-19 – 2018-03-23 (×5): 0.125 mg via ORAL
  Filled 2018-03-19 (×5): qty 1

## 2018-03-19 MED ORDER — DIGOXIN 0.25 MG/ML IJ SOLN
0.5000 mg | Freq: Once | INTRAMUSCULAR | Status: AC
  Administered 2018-03-19: 0.5 mg via INTRAVENOUS
  Filled 2018-03-19: qty 2

## 2018-03-19 MED ORDER — ALBUTEROL SULFATE (2.5 MG/3ML) 0.083% IN NEBU: 2.5000 mg | INHALATION_SOLUTION | RESPIRATORY_TRACT | Status: DC | PRN

## 2018-03-19 MED ORDER — TRAMADOL HCL 50 MG PO TABS
50.0000 mg | ORAL_TABLET | Freq: Four times a day (QID) | ORAL | Status: DC | PRN
  Administered 2018-03-20 – 2018-03-23 (×2): 50 mg via ORAL
  Filled 2018-03-19 (×2): qty 1

## 2018-03-19 MED ORDER — LEVOTHYROXINE SODIUM 25 MCG PO TABS
125.0000 ug | ORAL_TABLET | Freq: Every day | ORAL | Status: DC
  Administered 2018-03-20 – 2018-03-23 (×4): 125 ug via ORAL
  Filled 2018-03-19 (×4): qty 1

## 2018-03-19 MED ORDER — OXYCODONE HCL 5 MG PO TABS
5.0000 mg | ORAL_TABLET | ORAL | Status: DC | PRN
  Administered 2018-03-19 – 2018-03-21 (×2): 10 mg via ORAL
  Administered 2018-03-21 (×2): 5 mg via ORAL
  Administered 2018-03-23: 10 mg via ORAL
  Filled 2018-03-19: qty 1
  Filled 2018-03-19 (×2): qty 2
  Filled 2018-03-19: qty 1
  Filled 2018-03-19: qty 2

## 2018-03-19 MED ORDER — DILTIAZEM HCL 60 MG PO TABS
60.0000 mg | ORAL_TABLET | Freq: Four times a day (QID) | ORAL | Status: DC
  Administered 2018-03-19 – 2018-03-21 (×9): 60 mg via ORAL
  Filled 2018-03-19 (×9): qty 1

## 2018-03-19 MED ORDER — CYANOCOBALAMIN 500 MCG PO TABS
1000.0000 ug | ORAL_TABLET | Freq: Every day | ORAL | Status: DC
  Administered 2018-03-19 – 2018-03-22 (×4): 1000 ug via ORAL
  Filled 2018-03-19 (×4): qty 2

## 2018-03-19 MED ORDER — DILTIAZEM HCL-DEXTROSE 100-5 MG/100ML-% IV SOLN (PREMIX)
5.0000 mg/h | INTRAVENOUS | Status: AC
  Administered 2018-03-19 (×2): 12.5 mg/h via INTRAVENOUS
  Filled 2018-03-19 (×2): qty 100

## 2018-03-19 MED ORDER — ACETAMINOPHEN 325 MG PO TABS
650.0000 mg | ORAL_TABLET | ORAL | Status: DC | PRN
  Administered 2018-03-22: 650 mg via ORAL
  Filled 2018-03-19: qty 2

## 2018-03-19 MED ORDER — GABAPENTIN 100 MG PO CAPS
100.0000 mg | ORAL_CAPSULE | Freq: Three times a day (TID) | ORAL | Status: DC
  Administered 2018-03-19 – 2018-03-23 (×11): 100 mg via ORAL
  Filled 2018-03-19 (×12): qty 1

## 2018-03-19 NOTE — Progress Notes (Signed)
ANTICOAGULATION CONSULT NOTE - Follow Up Consult  Pharmacy Consult for Heparin  Indication: chest pain/ACS and atrial fibrillation  Allergies  Allergen Reactions  . Atorvastatin Other (See Comments) and Hives    Hives.   . Diclofenac Sodium Nausea And Vomiting and Palpitations  . Hydrocodone Itching    Patient Measurements: Height: 5\' 6"  (167.6 cm) Weight: 155 lb 6.8 oz (70.5 kg) IBW/kg (Calculated) : 59.3  Vital Signs: Temp: 99.2 F (37.3 C) (06/21 0007) Temp Source: Oral (06/21 0007) BP: 98/71 (06/21 0250) Pulse Rate: 119 (06/21 0250)  Labs: Recent Labs    03/18/18 2103 03/18/18 2247 03/19/18 0254  HGB 9.7*  --   --   HCT 31.3*  --   --   PLT 164  --   --   LABPROT  --  15.6*  --   INR  --  1.25  --   HEPARINUNFRC  --   --  0.48  CREATININE 0.96  --   --   TROPONINI 0.16*  --   --     Estimated Creatinine Clearance: 40.1 mL/min (by C-G formula based on SCr of 0.96 mg/dL).    Assessment: 84 y/o F on heparin for afib and chest pain. Some question of whether she had been taking Xarelto recently. MAR from nursing facility shows no Xarelto in the month of June-this is likely accurate. Will use heparin level only to dose for now. Heparin level this AM is therapeutic x 1   Goal of Therapy:  Heparin level 0.3-0.7 units/ml Monitor platelets by anticoagulation protocol: Yes   Plan:  -Cont heparin at 950 units/hr -1200 HL  Narda Bonds 03/19/2018,3:37 AM

## 2018-03-19 NOTE — Progress Notes (Signed)
  Progress Note    03/19/2018 8:09 AM * No surgery found *  Subjective: Patient has multiple complaints today including continued right groin pain, abdominal pain, her head is itching.  States that her chest pain has resolved.  Vitals:   03/19/18 0342 03/19/18 0415  BP: (!) 104/55 96/70  Pulse: (!) 135 (!) 118  Resp:  12  Temp:    SpO2:  95%    Physical Exam: Awake alert and oriented Nonlabored respirations Abdomen is soft and nontender no masses are palpable I cannot really examine her right groin due to tenderness there are no overt signs of abnormality She has monophasic signals in her bilateral dorsalis pedis arteries and her rate is elevated and irregular Capillary refill in both toes less than 2 seconds and feet do not appear ischemic Her feet are bilateral sensorimotor intact  CBC    Component Value Date/Time   WBC 7.5 03/18/2018 2103   RBC 2.99 (L) 03/18/2018 2103   HGB 9.7 (L) 03/18/2018 2103   HCT 31.3 (L) 03/18/2018 2103   PLT 164 03/18/2018 2103   MCV 104.7 (H) 03/18/2018 2103   MCH 32.4 03/18/2018 2103   MCHC 31.0 03/18/2018 2103   RDW 16.2 (H) 03/18/2018 2103   LYMPHSABS 1.6 03/18/2018 2103   MONOABS 0.6 03/18/2018 2103   EOSABS 0.0 03/18/2018 2103   BASOSABS 0.0 03/18/2018 2103    BMET    Component Value Date/Time   NA 139 03/18/2018 2103   K 4.1 03/18/2018 2103   CL 109 03/18/2018 2103   CO2 21 (L) 03/18/2018 2103   GLUCOSE 116 (H) 03/18/2018 2103   BUN 12 03/18/2018 2103   CREATININE 0.96 03/18/2018 2103   CALCIUM 7.7 (L) 03/18/2018 2103   GFRNONAA 52 (L) 03/18/2018 2103   GFRAA >60 03/18/2018 2103    INR    Component Value Date/Time   INR 1.25 03/18/2018 2247     Intake/Output Summary (Last 24 hours) at 03/19/2018 0809 Last data filed at 03/19/2018 0656 Gross per 24 hour  Intake 2180.55 ml  Output 350 ml  Net 1830.55 ml     Assessment:  84 y.o. female is here with multiple complaints and initially occluded chest pain and  abdominal pain as well as right groin pain.  CT scan was performed and did not demonstrate any new vascular issues.  Plan: Etiology of pain remains unclear Vascular issues appear chronic.  Will check ABI today to document flow   Marialuisa Basara C. Donzetta Matters, MD [facility] [place] Office: [phone] Pager: [phone]  03/19/2018 8:09 AM

## 2018-03-19 NOTE — Progress Notes (Signed)
ANTICOAGULATION CONSULT NOTE - Follow Up Consult  Pharmacy Consult for Heparin  Indication: chest pain/ACS and atrial fibrillation  Allergies  Allergen Reactions  . Atorvastatin Other (See Comments) and Hives    Hives.   . Diclofenac Sodium Nausea And Vomiting and Palpitations  . Hydrocodone Itching    Patient Measurements: Height: 5\' 6"  (167.6 cm) Weight: 153 lb 9.6 oz (69.7 kg) IBW/kg (Calculated) : 59.3  Vital Signs: Temp: 99.5 F (37.5 C) (06/21 0918) Temp Source: Oral (06/21 0918) BP: 133/66 (06/21 0918) Pulse Rate: 55 (06/21 0918)  Labs: Recent Labs    03/18/18 2103 03/18/18 2247 03/19/18 0254 03/19/18 0829 03/19/18 1119  HGB 9.7*  --   --  10.0*  --   HCT 31.3*  --   --  31.9*  --   PLT 164  --   --  176  --   APTT  --   --   --  87*  --   LABPROT  --  15.6*  --   --   --   INR  --  1.25  --   --   --   HEPARINUNFRC  --   --  0.48  --  0.39  CREATININE 0.96  --   --  0.92  --   TROPONINI 0.16*  --  0.17*  --  0.14*    Estimated Creatinine Clearance: 41.9 mL/min (by C-G formula based on SCr of 0.92 mg/dL).    Assessment: 83 y/o F on heparin for afib and chest pain. Some question of whether she had been taking Xarelto recently. MAR from nursing facility shows no Xarelto in the month of June-this is likely accurate. Will use heparin level only to dose for now. Heparin level is therapeutic x 2  Goal of Therapy:  Heparin level 0.3-0.7 units/ml Monitor platelets by anticoagulation protocol: Yes   Plan:  -Cont heparin at 950 units/hr -Daily heparin level, CBC  Thank you Anette Guarneri, PharmD [phone]  03/19/2018,12:46 PM

## 2018-03-19 NOTE — Progress Notes (Addendum)
[place] TEAM 1 - Stepdown/ICU TEAM  Yakima M. Mirkin  DUK:[id number] DOB: 1930/10/11 DOA: 03/18/2018 PCP: Dierdre Searles, MD    Brief Narrative:  84 y.o. female with a hx of COPD, CAD s/p CABG, PAD s/p B iliac stents and a left fem-pop bypass, hypothyroidism, and chronic systolic CHF who presented to the ED w/ severe right leg pain for 3 days. She also complained of chest pain with mild shortness of breath and nausea.    Patient initially presented to [facility] ED where there was concern for acute ischemia involving the right leg. Vascular Surgery at [facility] was consulted, and an ED-ED transfer was undertaken. Upon arrival to the [facility] ED, the patient was found to be tachycardic in the 130s w/ EKG noting atrial fibrillation.  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  Vascular surgery evaluated patient in the ED.  Significant Events: 6/20 admit   Subjective: Alert and conversant.  C/o ongoing pain "in the right groin."  She also c/o itchiness of the bottom of her R foot and her scalp.  She denies SSCP, n/v, or abdom pain.  She appears to be resting comfortably.    Assessment & Plan:  Atrial fibrillation with RVR  CHADS-VASc at least 4 - currently on IV heparin - rate much better controlled - attempt to transition to oral meds   Chest pain - elevated troponin - CAD  CTa negative for dissection or PE - Cards following - not felt to be USAP at this time   Severe right leg pain; PAD  s/p bilateral iliac stents and left fem-pop bypass - Vascular Surgery consulting - no evidence of acute ischemia - pt now describing pain a more "in her groin" - will Xray hips to r/o bone pain if persists, though  CTa did image hips and no findings were mentioned   Chronic systolic CHF  well-compensated - no TTE on record here - no volume overload on exam   COPD  Well compensated at this time   Hypothyroidism  Cont home synthroid but in setting of Afib will check TSH / Free T4  AAA  4 cm infrarenal AAA noted on CTA - follow-up US recommended in 1 year    DVT prophylaxis: IV heparin  Code Status: FULL CODE Family Communication: no family present at time of exam  Disposition Plan: SDU until clear off CCB gtt   Consultants:  [facility] VVS  Antimicrobials:  none   Objective: Blood pressure 112/76, pulse (!) 55, temperature 98.1 F (36.7 C), temperature source Oral, resp. rate 12, height 5\' 6"  (1.676 m), weight 69.7 kg (153 lb 9.6 oz), SpO2 99 %.  Intake/Output Summary (Last 24 hours) at 03/19/2018 1523 Last data filed at 03/19/2018 0656 Gross per 24 hour  Intake 1180.55 ml  Output 350 ml  Net 830.55 ml   Filed Weights   03/18/18 2100 03/18/18 2205 03/19/18 0351  Weight: 68 kg (150 lb) 70.5 kg (155 lb 6.8 oz) 69.7 kg (153 lb 9.6 oz)    Examination: General: No acute respiratory distress Lungs: Clear to auscultation bilaterally without wheezes or crackles Cardiovascular: RR w/ irreg rythmn - no M or rub  Abdomen: Nontender, nondistended, soft, bowel sounds positive, no rebound, no ascites, no appreciable mass  Extremities: No significant cyanosis, clubbing, or edema bilateral lower extremities  CBC: Recent Labs  Lab 03/18/18 2103 03/19/18 0829  WBC 7.5 10.2  NEUTROABS 5.2  --   HGB 9.7* 10.0*  HCT 31.3* 31.9*  MCV 104.7* 102.6*  PLT 164 299   Basic Metabolic Panel: Recent Labs  Lab 03/18/18 2103 03/19/18 0829  NA 139 137  K 4.1 4.2  CL 109 108  CO2 21* 18*  GLUCOSE 116* 96  BUN 12 12  CREATININE 0.96 0.92  CALCIUM 7.7* 8.0*   GFR: Estimated Creatinine Clearance: 41.9 mL/min (by C-G formula based on SCr of 0.92 mg/dL).  Liver Function Tests: Recent  Labs  Lab 03/18/18 2103  AST 19  ALT 12*  ALKPHOS 29*  BILITOT 0.6  PROT 5.1*  ALBUMIN 3.0*    Coagulation Profile: Recent Labs  Lab 03/18/18 2247  INR 1.25    Cardiac Enzymes: Recent Labs  Lab 03/18/18 2103 03/19/18 0254 03/19/18 1119  TROPONINI 0.16* 0.17* 0.14*    Recent Results (from the past 240 hour(s))  MRSA PCR Screening     Status: None   Collection Time: 03/18/18 11:17 PM  Result Value Ref Range Status   MRSA by PCR NEGATIVE NEGATIVE Final    Comment:        The GeneXpert MRSA Assay (FDA approved for NASAL specimens only), is one component of a comprehensive MRSA colonization surveillance program. It is not intended to diagnose MRSA infection nor to guide or monitor treatment for MRSA infections. Performed at [facility], [place] [address] [place], [place] [address]      Scheduled Meds: . digoxin  0.125 mg Oral Daily  . fluticasone furoate-vilanterol  1 puff Inhalation Daily  . ipratropium-albuterol  3 mL Nebulization TID  . sodium chloride flush  3 mL Intravenous Q12H   Continuous Infusions: . sodium chloride    . dilTIAZem HCl-Dextrose 12.5 mg/hr (03/19/18 1346)  . heparin 950 Units/hr (03/18/18 2135)     LOS: 1 day   Cherene Altes, MD [facility] Office  [phone] Pager - Text Page per Amion as per below:  On-Call/Text Page:      [email or web address]      password [email or web address]  If 7PM-7AM, please contact night-coverage [email or web address] Password [facility] 03/19/2018, 3:23 PM

## 2018-03-19 NOTE — Consult Note (Addendum)
Cardiology Consultation:   Patient ID: Rita Holmes; [id number]; Oct 19, 1930   Admit date: 03/18/2018 Date of Consult: 03/19/2018  Primary Care Provider: Dierdre Searles, MD Primary Cardiologist: Dr. Otho Perl Primary Electrophysiologist:  None   Patient Profile:   Rita Holmes is a 84 y.o. female with a PMH of CAD s/p CABG 1998 (last LHC 2017 with patent grafts), chronic systolic CHF, PAF on xarelto, PVD s/p b/l iliac stents and L fem-pop bypass, SSS s/p PPM, hypothyroidism, and COPD, who is being seen today for the evaluation of chest pain at the request of Dr. Thereasa Solo.  History of Present Illness:   [name] presented with complaints of severe right leg pain for the past 3 days, as well as chest pain x1 day. She states her chest pain started on the morning of 03/18/18, described as severe, radiating to her back, and was constant. She reported associated SOB and nausea. She denied any alleviating or exacerbating factors. She states her symptoms felt similar to the MI she experienced prior to her CABG. She has also been experiencing severe RLE pain for the last several days which is exacerbated by movement and worse in her groin area. She initially presented to [facility] for evaluation, however was transferred to [facility] given concerns for limb ischemia.   She was last seen by her Cardiologist Dr. Otho Perl 02/01/18 and was thought to be doing well from a cardiac standpoint. At that time, she reported some chest discomfort the day before her appointment and some episodes of palpitations. No medication changes occurred at this visit. Her last echo 11/2017 had EF 50-55%, moderate MR, mod-severe TR, and moderate LAE. Her last LHC 11/2015 with patent SVG to OM, SVT to Ramus, and LIMA to LAD.   Her primary complaint is ongoing severe RLE pain. She states pain is exacerbated by the slightest movement. She reports her CP resolved with 2 SL nitro. She is a poor historian and does not recall her  recent medical history or medications. She has a chronic cough from COPD which is unchanged. She denies orthopnea, PND, LE edema, or syncope. She has a early ulcer forming on her left lateral malleolus.   Hospital course: Tachycardic to the 130s-140s on presentation, hypotensive after receiving IV metoprolol, intermittently tachypneic, satting well on O2 via [place]. Labs notable for electrolytes wnl, Cr 0.96, Hgb 9.7, PLT 164, Trop 0.16>0.16>0.17. EKG with atrial fibrillation with RVR, rate 134, RBBB, PVC (no comparison). CTA AO+BIFEM  With 4cm AAA, patent b/l iliac stents, chronic occlusion of R SFA stent and pPopliteal artery; patent L fem-pop bypass graft, and high-grade stenosis of the SMA. CTA Chest without aortic dissection or aneurysm, no PE, pleural effusion, or PNA. Vascular surgery consulted and recommended ABIs; thought CT findings appeared chronic. Patient was admitted to medicine. Cardiology consulted for chest pain.   Past Medical History:  Diagnosis Date  . Abdominal aneurysm ([place])   . Anemia   . Anxiety   . Arterial occlusive disease   . Arthritis    "all over me"  . Atherosclerotic heart disease   . Bleeding gastric ulcer   . Breast cancer, left breast ([place])   . Chronic back pain    "all over my back"  . Chronic bronchitis ([place])   . COPD (chronic obstructive pulmonary disease) ([place])   . Depression   . Diverticulitis   . Emphysema lung ([place])   . Fibromyalgia   . Gastroesophageal reflux disease   . History of blood transfusion  related to "surgeries"  . History of hiatal hernia   . Hyperlipemia   . Hypertension   . Hypothyroidism   . Migraine    "3 days around my periods; now they come ~ weekly but only last ~ 10 minutes" (03/28/2018)  . Myocardial infarction ([place])    "before OHS"  . On home oxygen therapy    "@ night" (03/18/2018)  . PAD (peripheral artery disease) ([place])   . Paroxysmal A-fib ([place])   . Pneumonia    "once or twice" (03/18/2018)  . Presence of  permanent cardiac pacemaker   . PVD (peripheral vascular disease) ([place])     Past Surgical History:  Procedure Laterality Date  . APPENDECTOMY    . BACK SURGERY    . BLADDER SURGERY    . BREAST BIOPSY Left   . CAROTID ENDARTERECTOMY Bilateral   . CARPAL TUNNEL RELEASE Bilateral   . CATARACT EXTRACTION W/ INTRAOCULAR LENS  IMPLANT, BILATERAL Bilateral   . CORONARY ANGIOPLASTY WITH STENT PLACEMENT     "I've got 3 stents" (03/18/2018)  . CORONARY ARTERY BYPASS GRAFT     "CABG X3" (03/18/2018)  . FEMORAL BYPASS Left    hx/notes 03/18/2018  . FOOT SURGERY Bilateral    "big knots on them"  . INSERT / REPLACE / REMOVE PACEMAKER    . INSERTION OF ILIAC STENT Bilateral    hx/notes 03/18/2018  . JOINT REPLACEMENT    . MASTECTOMY Left   . POSTERIOR LUMBAR FUSION    . SHOULDER ARTHROSCOPY W/ ROTATOR CUFF REPAIR Left   . TOTAL KNEE ARTHROPLASTY Right   . TOTAL THYROIDECTOMY  07/2008   Archie Endo 01/28/2011  . TUBAL LIGATION       Home Medications:  Prior to Admission medications   Medication Sig Start Date End Date Taking? Authorizing Provider  acetaminophen (TYLENOL) 325 MG tablet Take 650 mg by mouth 3 (three) times daily.   Yes [provider]  albuterol (PROVENTIL HFA) 108 (90 Base) MCG/ACT inhaler Inhale 2 puffs into the lungs every 6 (six) hours as needed for wheezing or shortness of breath.  10/20/11  Yes [provider]  Calcium Carb-Ergocalciferol 500-200 MG-UNIT TABS Take by mouth.   Yes [provider]  clopidogrel (PLAVIX) 75 MG tablet Take 75 mg by mouth daily.   Yes [provider]  diphenhydrAMINE (BENADRYL) 25 MG tablet Take 25 mg by mouth every 8 (eight) hours as needed for itching.   Yes [provider]  docusate sodium (COLACE) 100 MG capsule Take 200 mg by mouth daily.   Yes [provider]  fluticasone (FLONASE) 50 MCG/ACT nasal spray Place 2 sprays into both nostrils daily.   Yes [provider]  fluticasone  furoate-vilanterol (BREO ELLIPTA) 100-25 MCG/INH AEPB Inhale 1 puff into the lungs daily.   Yes [provider]  furosemide (LASIX) 20 MG tablet Take 20 mg by mouth 2 (two) times daily.  06/07/15  Yes [provider]  gabapentin (NEURONTIN) 100 MG capsule Take 100 mg by mouth 3 (three) times daily.    Yes [provider]  ipratropium-albuterol (DUONEB) 0.5-2.5 (3) MG/3ML SOLN Take 3 mLs by nebulization 3 (three) times daily.   Yes [provider]  levothyroxine (SYNTHROID, LEVOTHROID) 125 MCG tablet Take 125 mcg by mouth daily before breakfast.   Yes [provider]  lisinopril (PRINIVIL,ZESTRIL) 5 MG tablet Take 5 mg by mouth daily.   Yes [provider]  metoprolol tartrate (LOPRESSOR) 25 MG tablet Take 25  mg by mouth 2 (two) times daily.  12/07/17  Yes [provider]  nitroGLYCERIN (NITROSTAT) 0.4 MG SL tablet Place 0.4 mg under the tongue every 5 (five) minutes as needed for chest pain.  04/29/10  Yes [provider]  Omega-3 Fatty Acids (FISH OIL) 1000 MG CAPS Take 1,000 mg by mouth at bedtime.   Yes [provider]  polyethylene glycol (MIRALAX / GLYCOLAX) packet Take 17 g by mouth daily as needed.   Yes [provider]  potassium chloride (K-DUR) 10 MEQ tablet Take 10 mEq by mouth daily.   Yes [provider]  pravastatin (PRAVACHOL) 40 MG tablet Take 40 mg by mouth daily.  12/14/15  Yes [provider]  tamoxifen (NOLVADEX) 20 MG tablet TAKE ONE TABLET BY MOUTH ONCE DAILY 04/21/16  Yes [provider]  traMADol (ULTRAM) 50 MG tablet Take 50 mg by mouth 3 (three) times daily.   Yes [provider]  traZODone (DESYREL) 50 MG tablet Take 25 mg by mouth at bedtime.   Yes [provider]  vitamin B-12 (CYANOCOBALAMIN) 500 MCG tablet Take 1,000 mcg by mouth at bedtime.   Yes [provider]    Inpatient Medications: Scheduled Meds: . fluticasone  furoate-vilanterol  1 puff Inhalation Daily  . ipratropium-albuterol  3 mL Nebulization TID  . sodium chloride flush  3 mL Intravenous Q12H   Continuous Infusions: . sodium chloride    . dilTIAZem HCl-Dextrose 12.5 mg/hr (03/19/18 0626)  . heparin 950 Units/hr (03/18/18 2135)   PRN Meds: sodium chloride, acetaminophen, albuterol, diphenhydrAMINE-zinc acetate, fentaNYL (SUBLIMAZE) injection, nitroGLYCERIN, ondansetron (ZOFRAN) IV, sodium chloride flush  Allergies:    Allergies  Allergen Reactions  . Atorvastatin Other (See Comments) and Hives    Hives.   . Diclofenac Sodium Nausea And Vomiting and Palpitations  . Hydrocodone Itching    Social History:   Social History   Socioeconomic History  . Marital status: Married    Spouse name: Not on file  . Number of children: Not on file  . Years of education: Not on file  . Highest education level: Not on file  Occupational History  . Not on file  Social Needs  . Financial resource strain: Not on file  . Food insecurity:    Worry: Not on file    Inability: Not on file  . Transportation needs:    Medical: Not on file    Non-medical: Not on file  Tobacco Use  . Smoking status: Former Smoker    Packs/day: 1.00    Years: 43.00    Pack years: 43.00    Types: Cigarettes    Last attempt to quit: 03/04/1994    Years since quitting: 24.0  . Smokeless tobacco: Never Used  Substance and Sexual Activity  . Alcohol use: Never    Frequency: Never  . Drug use: Never  . Sexual activity: Not on file  Lifestyle  . Physical activity:    Days per week: Not on file    Minutes per session: Not on file  . Stress: Not on file  Relationships  . Social connections:    Talks on phone: Not on file    Gets together: Not on file    Attends religious service: Not on file    Active member of club or organization: Not on file    Attends meetings of clubs or organizations: Not on file    Relationship status: Not on file  . Intimate partner  violence:  Fear of current or ex partner: Not on file    Emotionally abused: Not on file    Physically abused: Not on file    Forced sexual activity: Not on file  Other Topics Concern  . Not on file  Social History Narrative  . Not on file    Family History:    Family History  Problem Relation Age of Onset  . Transient ischemic attack Mother      ROS:  Please see the history of present illness.   All other ROS reviewed and negative.     Physical Exam/Data:   Vitals:   03/19/18 0340 03/19/18 0342 03/19/18 0351 03/19/18 0415  BP:  (!) 104/55  96/70  Pulse:  (!) 135  (!) 118  Resp:    12  Temp: 99.2 F (37.3 C)     TempSrc: Oral     SpO2:    95%  Weight:   153 lb 9.6 oz (69.7 kg)   Height:        Intake/Output Summary (Last 24 hours) at 03/19/2018 0801 Last data filed at 03/19/2018 0656 Gross per 24 hour  Intake 2180.55 ml  Output 350 ml  Net 1830.55 ml   Filed Weights   03/18/18 2100 03/18/18 2205 03/19/18 0351  Weight: 150 lb (68 kg) 155 lb 6.8 oz (70.5 kg) 153 lb 9.6 oz (69.7 kg)   Body mass index is 24.79 kg/m.  General:  Laying in bed groaning in pain HEENT: sclera anicteric  Neck: no JVD Vascular: No carotid bruits Cardiac:  Cardiac exam limited by groaning and harsh upper airway sounds. IRIR, no obvious M/G/R Lungs:  Harsh expiratory upper airway sounds. No obvious wheezing or crackles Abd: NABS, soft, obese nontender, no hepatomegaly Ext: no edema; left lateral malleolus with early skin breakdown; TTP of RLE Musculoskeletal:  No deformities, BUE and BLE strength normal and equal Skin: warm and dry  Neuro:  CNs 2-12 intact, no focal abnormalities noted Psych:  Normal affect   EKG:  The EKG was personally reviewed and demonstrates:  Atrial fibrillation with RVR, RBBB Telemetry:  Telemetry was personally reviewed and demonstrates:  afib with rate primarily in the 110s  Relevant CV Studies: Left Heart Catheterization 11/2015: Conclusions Diagnostic  Procedure Summary Multivessel CAD. SVG OM, SVG Ramus, LIMA LAD patent without significant lesions Normal LV function No target for intervention identified; no significant AV gradient noted Diagnostic Procedure Recommendations Recommend work up for non-cardiac causes of symptoms.  Echocardiogram 11/2017: Conclusions Summary Low Normal left ventricular systolic function with no appreciable segmental abnormality. Ejection fraction is visually estimated at 50-55% Moderate mitral regurgitation. Moderate/Severe tricuspid regurgitation. RVSP 42 mm Hg. Moderate Left Atrial enlargement  Laboratory Data:  Chemistry Recent Labs  Lab 03/18/18 2103  NA 139  K 4.1  CL 109  CO2 21*  GLUCOSE 116*  BUN 12  CREATININE 0.96  CALCIUM 7.7*  GFRNONAA 52*  GFRAA >60  ANIONGAP 9    Recent Labs  Lab 03/18/18 2103  PROT 5.1*  ALBUMIN 3.0*  AST 19  ALT 12*  ALKPHOS 29*  BILITOT 0.6   Hematology Recent Labs  Lab 03/18/18 2103  WBC 7.5  RBC 2.99*  HGB 9.7*  HCT 31.3*  MCV 104.7*  MCH 32.4  MCHC 31.0  RDW 16.2*  PLT 164   Cardiac Enzymes Recent Labs  Lab 03/18/18 2103 03/19/18 0254  TROPONINI 0.16* 0.17*    Recent Labs  Lab 03/18/18 1529  TROPIPOC 0.16*    BNPNo  results for input(s): BNP, PROBNP in the last 168 hours.  DDimer No results for input(s): DDIMER in the last 168 hours.  Radiology/Studies:  Ct Angio Aortobifemoral W And/or Wo Contrast  Result Date: 03/18/2018 CLINICAL DATA:  Right leg pain and chest pain history of arterial occlusion EXAM: CT ANGIOGRAPHY OF ABDOMINAL AORTA WITH ILIOFEMORAL RUNOFF TECHNIQUE: Multidetector CT imaging of the abdomen, pelvis and lower extremities was performed using the standard protocol during bolus administration of intravenous contrast. Multiplanar CT image reconstructions and MIPs were obtained to evaluate the vascular anatomy. CONTRAST:  100 mL Isovue 370 intravenous COMPARISON:  CTA runoff 07/07/2016, CT abdomen pelvis  10/25/2017, 06/18/2017 FINDINGS: VASCULAR Aorta: Moderate severe aortic atherosclerosis with diffuse mural thrombus. Infrarenal abdominal aortic aneurysm measuring 4 cm maximum dimension over a craniocaudad span of 5.3 cm. Extensive mural thrombus in the aneurysm sac. No dissection seen. Celiac: Heavily calcified at the origin with at least moderate stenosis suspected. Heavily calcified splenic artery without aneurysm formation. SMA: Heavily calcified at the origin. Severe stenosis at the origin with moderate diffuse disease of SMA distal branches with flow enhancement present distally. Renals: Heavily calcified at the origin with moderate to severe stenosis suspected. S IMA: Patent but also heavily calcified at the origin. RIGHT Lower Extremity Inflow: Bilateral iliac stents. Right limb of the stent is patent. Mild to moderate calcific disease of the external iliac artery on the right but with patency. Heavily calcified origin of right internal iliac artery with moderate severe diffuse disease present. Outflow: Moderate calcification of the right common femoral artery. Chronic occlusion of the right SFA just past the origin with occluded SFA stent as before. Deep femoral arterial branches are patent. Limited evaluation of popliteal artery due to streak artifact from metallic hardware. Heavily calcified popliteal artery. Runoff: Difficult due to diffuse calcific disease. Primary runoff via the anterior tibial artery which demonstrates diffuse disease. Diffusely diseased posterior tibial artery. Distal posterior tibial artery appears occluded just above the ankle. Reconstitution of the distal peroneal artery via small collateral vessels. Flow enhancement visible within the dorsalis pedis artery. LEFT Lower Extremity Inflow: Bilateral iliac stent. Narrowed appearance of the left limb but with patency noted. Mild disease of the external iliac artery but with patency. Heavily calcified left internal iliac artery.  Outflow: Deep femoral artery is patent with calcific disease of distal branches. Chronic occlusion of the native superficial femoral artery just be on the origin. Left femoropopliteal bypass graft to above the knee with mural disease and narrowing present throughout the graft but with flow enhancement/patency established. Moderate calcific disease of the native popliteal artery with moderate stenosis. Runoff: Diseased runoff via the anterior tibial artery with flow enhancement in the dorsal pedalis. Diminutive posterior tibial artery with diffuse disease. Diminutive peroneal which occludes in the calf. Review of the MIP images confirms the above findings. NON-VASCULAR Lower chest: Nonacute. Hepatobiliary: No focal liver abnormality is seen. No gallstones, gallbladder wall thickening, or biliary dilatation. Pancreas: Unremarkable. No pancreatic ductal dilatation or surrounding inflammatory changes. Spleen: Normal in size without focal abnormality. Adrenals/Urinary Tract: Adrenal glands are unremarkable. Kidneys are normal, without renal calculi, focal lesion, or hydronephrosis. Bladder is unremarkable. Stomach/Bowel: Stomach is nonenlarged. No dilated small bowel. Colon shows no focal wall thickening. Sigmoid colon diverticular disease Lymphatic: No significantly enlarged lymph nodes Reproductive: Prominent uterus for age, no adnexal mass. Other: Negative for free air or free fluid. Small fat in the umbilical region Musculoskeletal: Post sternotomy changes. Posterior spinal rods and fixating screws L1 through L5.  2 cm right popliteal fossa cyst. Mild diffuse atrophy of the left calf muscles. Subcutaneous edema within the left lower extremity at the ankle and foot. No acute osseous abnormality is seen. Metallic hardware in the right knee from replacement. IMPRESSION: VASCULAR 1. Infrarenal abdominal aortic aneurysm measuring up to 4 cm. Recommend followup by ultrasound in 1 year. This recommendation follows ACR  consensus guidelines: White Paper of the ACR Incidental Findings Committee II on Vascular Findings. J Am Coll Radiol 2013; 10:789-794. No acute aortic dissection is seen. 2. Status post bilateral iliac artery stents with stent patency. 3. 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 Chronically occluded right SFA stent. Diseased runoff, primarily via the right anterior tibial artery with reconstitution of the distal popliteal artery as before. 4. Status post left femoropopliteal bypass graft with diseased but patent graft. 5. Heavy calcific disease of major branch vessels within the abdomen with suspected high-grade stenosis at the origin of the SMA. NON-VASCULAR 1. No CT evidence for acute intra-abdominal or pelvic abnormality. 2. Sigmoid colon diverticular disease without acute inflammation 3. 2 cm right popliteal fossa cyst Electronically Signed   By: Donavan Foil M.D.   On: 03/18/2018 20:12   Ct Angio Chest Aorta W/cm &/or Wo/cm  Result Date: 03/18/2018 CLINICAL DATA:  Chest pain EXAM: CT ANGIOGRAPHY CHEST WITH CONTRAST TECHNIQUE: Multidetector CT imaging of the chest was performed using the standard protocol during bolus administration of intravenous contrast. Multiplanar CT image reconstructions and MIPs were obtained to evaluate the vascular anatomy. CONTRAST:  100 mL Isovue 370 intravenous COMPARISON:  Chest x-ray 03/18/2018, PET-CT 05/21/2017, CT a chest 03/09/2017 FINDINGS: Cardiovascular: Nonaneurysmal aorta. No dissection seen. Go not tailored for pulmonary embolus study, no gross central filling defects are seen. Moderate aortic atherosclerosis. Left-sided arch with 3 vessel origin. Post CABG changes. Coronary vascular calcification. Heart size upper normal. No pericardial effusion. Mediastinum/Nodes: Midline trachea. No significant mediastinal adenopathy. Small distal  esophageal hiatal hernia. Lungs/Pleura: Stable 4 mm right upper lobe pulmonary nodule. Mild emphysema. No acute consolidation or effusion. No pneumothorax. Upper Abdomen: No acute abnormality. Musculoskeletal: Post sternotomy changes. No acute or suspicious abnormality. Post left mastectomy. Review of the MIP images confirms the above findings. IMPRESSION: 1. Negative for aortic dissection or aneurysm. No gross central filling defects within the pulmonary arterial system. 2. Mild emphysema.  No acute consolidation or pleural effusion. Aortic Atherosclerosis (ICD10-I70.0) and Emphysema (ICD10-J43.9). Electronically Signed   By: Donavan Foil M.D.   On: 03/18/2018 19:02    Assessment and Plan:   1. Chest pain in patient with CAD s/p CABG: patient presents with non-exertional chest pain a/w SOB with radiation of pain to her back. Trop trend flat 0.16>0.16>0.17. EKG with afib RVR. CTA chest without aortic dissection. Echo 11/2017 with stable EF 50-55%, moderate MR, and mod-severe TR. Last LHC 2017 with patent grafts. Trop trend not consistent with ACS. More likely demand ischemia in the setting of Afib RVR - Could consider NST to further investigate chest pain complaints - Will have PPM interrogated  - Continue ASA . Elevated troponin: Flat trend, not consistent with ACS. --   2. Atrial fibrillation with RVR: rate 130s-140s on presentation. She became hypotensive with IV metoprolol. She was started on IV diltiazem gtt and rates now in the 110s. She was on xarelto outpatient for paroxysmal Afib - last dose unknown - Continue heparin gtt given possible procedures  for CHA2DS2-VASc Score of 5 (CHF, Vascular, Female, and Age >75) - Continue diltiazem gtt for now  3. Severe RLE pain: This is her primary complaint. Continues to have severe pain this morning. Has a history of PVD s/p b/l iliac stents (patent), and chronically occluded R SFA. Vascular surgery is following with plans for ABIs today.  - Continue  management per vascular surgery and medicine team   For questions or updates, please contact [facility] Please consult [email or web address] for contact info under Cardiology/STEMI.   Signed, Abigail Butts, PA-C  03/19/2018 8:01 AM [phone]   I have seen, examined and evaluated the patient this AM along with Abigail Butts, PA-C .  After reviewing all the available data and chart, we discussed the patients laboratory, study & physical findings as well as symptoms in detail. I agree with her findings, examination as well as impression recommendations as per our discussion.    Very complicated difficult situation with this elderly woman who is clearly in pain, and unable to really provide any history.  Physical examination is essentially useless because she is moving and making noise.  Cannot get her to stay still for to listen to her heart or lungs.  From a cardiac standpoint she has not had a flat troponin elevation in the setting of A. fib RVR with significant amount of pain.  I would suspect that this is probably demand ischemia and would not pursue any ischemic evaluation.  She is not having any more chest pain.  Her heart rates have actually become difficult to control with IV diltiazem, however would not further titrate up because a lot of this is driven by pain.  We do not know if she has been on antiregulation before, but I agree with using IV heparin which can be turned off her on for any potential invasive procedures.  Longer she goes with A. fib RVR, may need to consider some diastolic heart failure symptoms and would need to consider the possibility of diuretic.  At this point I would not diurese.  We will follow along to assist with atrial fibrillation rate control/rhythm control and decisions about anti-coagulation when she is ready to be discharged.    Glenetta Hew, M.D., M.S. Interventional Cardiologist   Pager # [phone] Phone # [phone] [address] [place], [place] [address]

## 2018-03-20 ENCOUNTER — Inpatient Hospital Stay (HOSPITAL_COMMUNITY): Payer: Medicare Other

## 2018-03-20 DIAGNOSIS — I739 Peripheral vascular disease, unspecified: Secondary | ICD-10-CM

## 2018-03-20 LAB — CBC
HEMATOCRIT: 26.4 % — AB (ref 36.0–46.0)
HEMOGLOBIN: 8.3 g/dL — AB (ref 12.0–15.0)
MCH: 32.3 pg (ref 26.0–34.0)
MCHC: 31.4 g/dL (ref 30.0–36.0)
MCV: 102.7 fL — AB (ref 78.0–100.0)
Platelets: 158 10*3/uL (ref 150–400)
RBC: 2.57 MIL/uL — ABNORMAL LOW (ref 3.87–5.11)
RDW: 16 % — ABNORMAL HIGH (ref 11.5–15.5)
WBC: 7.3 10*3/uL (ref 4.0–10.5)

## 2018-03-20 LAB — COMPREHENSIVE METABOLIC PANEL
ALK PHOS: 25 U/L — AB (ref 38–126)
ALT: 13 U/L — AB (ref 14–54)
ANION GAP: 6 (ref 5–15)
AST: 19 U/L (ref 15–41)
Albumin: 2.7 g/dL — ABNORMAL LOW (ref 3.5–5.0)
BILIRUBIN TOTAL: 0.6 mg/dL (ref 0.3–1.2)
BUN: 10 mg/dL (ref 6–20)
CALCIUM: 7.8 mg/dL — AB (ref 8.9–10.3)
CO2: 24 mmol/L (ref 22–32)
CREATININE: 0.86 mg/dL (ref 0.44–1.00)
Chloride: 108 mmol/L (ref 101–111)
GFR calc Af Amer: >60 mL/min (ref >60)
GFR calc non Af Amer: 60 mL/min — ABNORMAL LOW (ref >60)
Glucose, Bld: 101 mg/dL — ABNORMAL HIGH (ref 65–99)
Potassium: 3.8 mmol/L (ref 3.5–5.1)
Sodium: 138 mmol/L (ref 135–145)
TOTAL PROTEIN: 4.7 g/dL — AB (ref 6.5–8.1)

## 2018-03-20 LAB — HEPARIN LEVEL (UNFRACTIONATED): Heparin Unfractionated: 0.35 IU/mL (ref 0.30–0.70)

## 2018-03-20 LAB — T4, FREE: FREE T4: 1.35 ng/dL (ref 0.82–1.77)

## 2018-03-20 LAB — MAGNESIUM: Magnesium: 1.7 mg/dL (ref 1.7–2.4)

## 2018-03-20 LAB — TSH: TSH: 1.203 u[IU]/mL (ref 0.350–4.500)

## 2018-03-20 MED ORDER — METOPROLOL TARTRATE 25 MG PO TABS
25.0000 mg | ORAL_TABLET | Freq: Two times a day (BID) | ORAL | Status: DC
  Administered 2018-03-20 – 2018-03-23 (×6): 25 mg via ORAL
  Filled 2018-03-20 (×6): qty 1

## 2018-03-20 MED ORDER — FENTANYL CITRATE (PF) 100 MCG/2ML IJ SOLN: 12.5000 ug | INTRAMUSCULAR | Status: DC | PRN

## 2018-03-20 MED ORDER — APIXABAN 5 MG PO TABS
5.0000 mg | ORAL_TABLET | Freq: Two times a day (BID) | ORAL | Status: DC
  Administered 2018-03-20 – 2018-03-22 (×5): 5 mg via ORAL
  Filled 2018-03-20 (×4): qty 1

## 2018-03-20 NOTE — Progress Notes (Signed)
[place] TEAM 1 - Stepdown/ICU TEAM  Chassity M. Ortloff  WJX:[id number] DOB: Dec 30, 1930 DOA: 03/18/2018 PCP: Dierdre Searles, MD    Brief Narrative:  84 y.o. female with a hx of COPD, CAD s/p CABG, PAD s/p B iliac stents and a left fem-pop bypass, hypothyroidism, and chronic systolic CHF who presented to the ED w/ severe right leg pain for 3 days. She also complained of chest pain with mild shortness of breath and nausea.    Patient initially presented to [facility] ED where there was concern for acute ischemia involving the right leg. Vascular Surgery at [facility] was consulted, and an ED-ED transfer was undertaken. Upon arrival to the [facility] ED, the patient was found to be tachycardic in the 130s w/ EKG noting atrial fibrillation.  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  Vascular Surgery evaluated patient in the ED.  Significant Events: 6/20 admit   Subjective: The patient tells me her right groin pain continues.  She explained that it seems to be brought on by moving her small toe on the right foot.  She denies chest pain shortness of breath nausea or vomiting.  Assessment & Plan:  Atrial fibrillation with RVR  CHADS-VASc at least 4 - currently on IV heparin - rate not yet ideal but better overall - some doses of oral CCB held due to moderate hypotension - resume her usual home BB and follow on tele - transition to oral anticoag amd discuss w/ VVS if long term use of plavix is still required w/ use of Eliquis  Chest pain - elevated troponin - CAD  CTa negative for dissection or PE - Cards following - not felt to be USAP at this time - denies any chest pain today    Severe right leg pain; PAD    s/p bilateral iliac stents and left fem-pop bypass - Vascular Surgery consulting - no evidence of acute ischemia - pt now describing pain as more "in her groin" - CTa imaged hips and no findings were mentioned - begin PT/OT - ?muscluar injury   Macrocytic anemia Check O13 and folic acid levels - ?if neuropathy is source of her leg/groin pain   Chronic systolic CHF  well-compensated - no TTE on record here - no volume overload on exam   COPD  Well compensated at this time   Hypothyroidism  Cont home synthroid - TSH is normal as is FT4  AAA  4 cm infrarenal AAA noted on CTA - follow-up US recommended in 1 year    DVT prophylaxis: IV heparin  Code Status: FULL CODE Family Communication: no family present at time of exam  Disposition Plan: transfer to tele - begin PT/OT - transition to oral anticoag   Consultants:  [facility] Cardiology VVS  Antimicrobials:  none   Objective: Blood pressure (!) 106/54, pulse 91, temperature 99.6 F (37.6 C), temperature source Oral, resp. rate 19, height 5\' 6"  (1.676 m), weight 68.2 kg (150 lb 4.8 oz), SpO2 96 %.  Intake/Output Summary (Last 24 hours) at 03/20/2018 1437 Last data filed at 03/20/2018 0900 Gross per 24 hour  Intake 720 ml  Output 1100 ml  Net -380 ml   Filed Weights   03/18/18 2205 03/19/18 0351 03/20/18 0427  Weight: 70.5 kg (  155 lb 6.8 oz) 69.7 kg (153 lb 9.6 oz) 68.2 kg (150 lb 4.8 oz)    Examination: General: No acute respiratory distress at rest  Lungs: CTA B - no wheezing  Cardiovascular: irreg irreg - rate 90bpm - no M appreciated  Abdomen: NT/ND, soft, bs+, no mass  Extremities: No significant edema bilateral lower extremities  CBC: Recent Labs  Lab 03/18/18 2103 03/19/18 0829 03/20/18 0508  WBC 7.5 10.2 7.3  NEUTROABS 5.2  --   --   HGB 9.7* 10.0* 8.3*  HCT 31.3* 31.9* 26.4*  MCV 104.7* 102.6* 102.7*  PLT 164 176 973   Basic Metabolic Panel: Recent Labs  Lab 03/18/18 2103 03/19/18 0829  03/20/18 0508  NA 139 137 138  K 4.1 4.2 3.8  CL 109 108 108  CO2 21* 18* 24  GLUCOSE 116* 96 101*  BUN 12 12 10   CREATININE 0.96 0.92 0.86  CALCIUM 7.7* 8.0* 7.8*  MG  --   --  1.7   GFR: Estimated Creatinine Clearance: 44.8 mL/min (by C-G formula based on SCr of 0.86 mg/dL).  Liver Function Tests: Recent Labs  Lab 03/18/18 2103 03/20/18 0508  AST 19 19  ALT 12* 13*  ALKPHOS 29* 25*  BILITOT 0.6 0.6  PROT 5.1* 4.7*  ALBUMIN 3.0* 2.7*    Coagulation Profile: Recent Labs  Lab 03/18/18 2247  INR 1.25    Cardiac Enzymes: Recent Labs  Lab 03/18/18 2103 03/19/18 0254 03/19/18 1119  TROPONINI 0.16* 0.17* 0.14*    Recent Results (from the past 240 hour(s))  MRSA PCR Screening     Status: None   Collection Time: 03/18/18 11:17 PM  Result Value Ref Range Status   MRSA by PCR NEGATIVE NEGATIVE Final    Comment:        The GeneXpert MRSA Assay (FDA approved for NASAL specimens only), is one component of a comprehensive MRSA colonization surveillance program. It is not intended to diagnose MRSA infection nor to guide or monitor treatment for MRSA infections. Performed at [facility], [place] [address] [place], [place] [address]      Scheduled Meds: . digoxin  0.125 mg Oral Daily  . diltiazem  60 mg Oral Q6H  . fluticasone furoate-vilanterol  1 puff Inhalation Daily  . gabapentin  100 mg Oral TID  . ipratropium-albuterol  3 mL Nebulization TID  . levothyroxine  125 mcg Oral QAC breakfast  . sodium chloride flush  3 mL Intravenous Q12H  . tamoxifen  20 mg Oral Daily  . vitamin B-12  1,000 mcg Oral QHS   Continuous Infusions: . sodium chloride    . heparin 950 Units/hr (03/19/18 1840)     LOS: 2 days   Cherene Altes, MD [facility] Office  [phone] Pager - Text Page per Amion as per below:  On-Call/Text Page:      [email or web address]      password [email or web address]  If 7PM-7AM, please contact night-coverage [email or web address] Password  [facility] 03/20/2018, 2:37 PM

## 2018-03-20 NOTE — Progress Notes (Addendum)
Subjective:  Continues to be a poor historian and falls asleep.  Her atrial fibrillation rate is better controlled.  Evidently no further procedures necessary for her legs.  Still with some groin pain.  Has been on Plavix previously.  Objective:  Vital Signs in the last 24 hours: BP (!) 106/54   Pulse 91   Temp 99.6 F (37.6 C) (Oral)   Resp 19   Ht 5\' 6"  (1.676 m)   Wt 68.2 kg (150 lb 4.8 oz)   SpO2 96%   BMI 24.26 kg/m   Physical Exam: Elderly white female poor historian  Lungs: Wheezing has improved today. Cardiac:   irregular rhythm, normal S1 and S2, no S3, 2/6 systolic murmurAbdomen:  Soft, nontender, no masses Extremities: Reduced pulses, bandage on left foot  Intake/Output from previous day: 06/21 0701 - 06/22 0700 In: 480 [P.O.:480] Out: 1100 [Urine:1100]  Weight Filed Weights   03/18/18 2205 03/19/18 0351 03/20/18 0427  Weight: 70.5 kg (155 lb 6.8 oz) 69.7 kg (153 lb 9.6 oz) 68.2 kg (150 lb 4.8 oz)    Lab Results: Basic Metabolic Panel: Recent Labs    03/19/18 0829 03/20/18 0508  NA 137 138  K 4.2 3.8  CL 108 108  CO2 18* 24  GLUCOSE 96 101*  BUN 12 10  CREATININE 0.92 0.86   CBC: Recent Labs    03/18/18 2103 03/19/18 0829 03/20/18 0508  WBC 7.5 10.2 7.3  NEUTROABS 5.2  --   --   HGB 9.7* 10.0* 8.3*  HCT 31.3* 31.9* 26.4*  MCV 104.7* 102.6* 102.7*  PLT 164 176 158   Cardiac Enzymes: Troponin (Point of Care Test) Recent Labs    03/18/18 1529  TROPIPOC 0.16*   Cardiac Panel (last 3 results) Recent Labs    03/18/18 2103 03/19/18 0254 03/19/18 1119  TROPONINI 0.16* 0.17* 0.14*    Telemetry: Atrial fibrillation rate response is still rapid but improved  Assessment/Plan:  1.  Atrial fibrillation rate response is somewhat improved today 2.  Significant peripheral vascular disease 3.  CAD with previous bypass grafting with patent grafts in March 2017  Recommendations:  I would continue rate control at this point.  I would place  her on anticoagulation at discharge.  She has previously been taken care of by Dr. Otho Perl I would use either low-dose Xarelto at her age at 53 mg orEliquis     W. Doristine Church  MD [facility] Cardiology  03/20/2018, 11:39 AM

## 2018-03-20 NOTE — Progress Notes (Signed)
Patient ID: Rita Holmes, female   DOB: [birth year], 84 y.o.   MRN: [id number] Chronic moderate to severe arterial insufficiency bilateral lower extremities.  Do not feel this is causing her ongoing issues of pain at multiple locations.  No role for invasive testing.  Okay to resume anticoagulation per cardiology

## 2018-03-20 NOTE — Plan of Care (Signed)
Patient changed from iv to PO Cardizem.  However patients BP low in night and held dose.  Rate in AM 90's and was able to take morning dose.  Other vitals remained stable .

## 2018-03-20 NOTE — Progress Notes (Signed)
ABI's have been completed. Right 0.33 Left 0.39  03/20/18 12:27 PM Rita Holmes RVT

## 2018-03-20 NOTE — Progress Notes (Signed)
ANTICOAGULATION CONSULT NOTE - Follow Up Consult  Pharmacy Consult for Heparin  Indication: chest pain/ACS and atrial fibrillation  Allergies  Allergen Reactions  . Atorvastatin Other (See Comments) and Hives    Hives.   . Diclofenac Sodium Nausea And Vomiting and Palpitations  . Hydrocodone Itching    Patient Measurements: Height: 5\' 6"  (167.6 cm) Weight: 150 lb 4.8 oz (68.2 kg) IBW/kg (Calculated) : 59.3  Vital Signs: Temp: 99.6 F (37.6 C) (06/22 0759) Temp Source: Oral (06/22 0759) BP: 106/54 (06/22 0759) Pulse Rate: 91 (06/22 0759)  Labs: Recent Labs    03/18/18 2103 03/18/18 2247 03/19/18 0254 03/19/18 0829 03/19/18 1119 03/20/18 0508  HGB 9.7*  --   --  10.0*  --  8.3*  HCT 31.3*  --   --  31.9*  --  26.4*  PLT 164  --   --  176  --  158  APTT  --   --   --  87*  --   --   LABPROT  --  15.6*  --   --   --   --   INR  --  1.25  --   --   --   --   HEPARINUNFRC  --   --  0.48  --  0.39 0.35  CREATININE 0.96  --   --  0.92  --  0.86  TROPONINI 0.16*  --  0.17*  --  0.14*  --     Estimated Creatinine Clearance: 44.8 mL/min (by C-G formula based on SCr of 0.86 mg/dL).    Assessment: 84 y/o F on heparin for afib and chest pain. Some question of whether she had been taking Xarelto recently. MAR from nursing facility shows no Xarelto in the month of June (last record of Xarelto in April, was taking clopidogrel prior to admission)  Heparin level therapeutic this morning HgB = 8.3 (trending down, no bleeding documented)   Goal of Therapy:  Heparin level 0.3-0.7 units/ml Monitor platelets by anticoagulation protocol: Yes   Plan:  -Cont heparin at 950 units/hr -Daily heparin level, CBC  Thank you Anette Guarneri, PharmD [phone]  03/20/2018,11:21 AM

## 2018-03-21 ENCOUNTER — Inpatient Hospital Stay (HOSPITAL_COMMUNITY): Payer: Medicare Other

## 2018-03-21 LAB — BASIC METABOLIC PANEL
Anion gap: 6 (ref 5–15)
BUN: 11 mg/dL (ref 6–20)
CALCIUM: 8 mg/dL — AB (ref 8.9–10.3)
CO2: 24 mmol/L (ref 22–32)
CREATININE: 0.81 mg/dL (ref 0.44–1.00)
Chloride: 103 mmol/L (ref 101–111)
GFR calc Af Amer: >60 mL/min (ref >60)
GFR calc non Af Amer: >60 mL/min (ref >60)
GLUCOSE: 95 mg/dL (ref 65–99)
Potassium: 4.2 mmol/L (ref 3.5–5.1)
Sodium: 133 mmol/L — ABNORMAL LOW (ref 135–145)

## 2018-03-21 LAB — PROCALCITONIN: Procalcitonin: <0.1 ng/mL

## 2018-03-21 LAB — CBC
HCT: 30 % — ABNORMAL LOW (ref 36.0–46.0)
Hemoglobin: 9.2 g/dL — ABNORMAL LOW (ref 12.0–15.0)
MCH: 32.1 pg (ref 26.0–34.0)
MCHC: 30.7 g/dL (ref 30.0–36.0)
MCV: 104.5 fL — AB (ref 78.0–100.0)
Platelets: 165 10*3/uL (ref 150–400)
RBC: 2.87 MIL/uL — ABNORMAL LOW (ref 3.87–5.11)
RDW: 16.1 % — AB (ref 11.5–15.5)
WBC: 7 10*3/uL (ref 4.0–10.5)

## 2018-03-21 LAB — MAGNESIUM: Magnesium: 1.8 mg/dL (ref 1.7–2.4)

## 2018-03-21 LAB — VITAMIN B12: Vitamin B-12: 1211 pg/mL — ABNORMAL HIGH (ref 180–914)

## 2018-03-21 LAB — FOLATE: FOLATE: 14.2 ng/mL (ref >5.9)

## 2018-03-21 LAB — CK: Total CK: 112 U/L (ref 38–234)

## 2018-03-21 MED ORDER — POLYETHYLENE GLYCOL 3350 17 G PO PACK
17.0000 g | PACK | Freq: Two times a day (BID) | ORAL | Status: DC
  Administered 2018-03-21 – 2018-03-23 (×4): 17 g via ORAL
  Filled 2018-03-21 (×4): qty 1

## 2018-03-21 MED ORDER — PRAVASTATIN SODIUM 40 MG PO TABS
40.0000 mg | ORAL_TABLET | Freq: Every day | ORAL | Status: DC
  Administered 2018-03-21 – 2018-03-22 (×2): 40 mg via ORAL
  Filled 2018-03-21 (×2): qty 1

## 2018-03-21 MED ORDER — FLUTICASONE PROPIONATE 50 MCG/ACT NA SUSP
2.0000 | Freq: Every day | NASAL | Status: DC
  Administered 2018-03-23: 2 via NASAL
  Filled 2018-03-21: qty 16

## 2018-03-21 MED ORDER — TRAZODONE HCL 50 MG PO TABS
25.0000 mg | ORAL_TABLET | Freq: Every day | ORAL | Status: DC
  Administered 2018-03-21 – 2018-03-22 (×2): 25 mg via ORAL
  Filled 2018-03-21 (×2): qty 1

## 2018-03-21 MED ORDER — SENNOSIDES-DOCUSATE SODIUM 8.6-50 MG PO TABS
2.0000 | ORAL_TABLET | Freq: Two times a day (BID) | ORAL | Status: DC
  Administered 2018-03-21 – 2018-03-23 (×5): 2 via ORAL
  Filled 2018-03-21 (×5): qty 2

## 2018-03-21 MED ORDER — MAGNESIUM CITRATE PO SOLN: 1.0000 | Freq: Every day | ORAL | Status: DC | PRN

## 2018-03-21 MED ORDER — CLOPIDOGREL BISULFATE 75 MG PO TABS
75.0000 mg | ORAL_TABLET | Freq: Every day | ORAL | Status: DC
  Administered 2018-03-21 – 2018-03-23 (×3): 75 mg via ORAL
  Filled 2018-03-21 (×3): qty 1

## 2018-03-21 MED ORDER — DIPHENHYDRAMINE HCL 25 MG PO CAPS
25.0000 mg | ORAL_CAPSULE | Freq: Three times a day (TID) | ORAL | Status: DC | PRN
  Filled 2018-03-21 (×2): qty 1

## 2018-03-21 MED ORDER — LISINOPRIL 5 MG PO TABS
5.0000 mg | ORAL_TABLET | Freq: Every day | ORAL | Status: DC
  Administered 2018-03-21: 5 mg via ORAL
  Filled 2018-03-21 (×2): qty 1

## 2018-03-21 MED ORDER — MAGNESIUM HYDROXIDE 400 MG/5ML PO SUSP: 5.0000 mL | Freq: Every day | ORAL | Status: DC | PRN

## 2018-03-21 NOTE — Progress Notes (Signed)
PROGRESS NOTE    Rita Holmes  AUQ:[id number] DOB: [birth year] DOA: 03/18/2018 PCP: Dierdre Searles, MD   Brief Narrative:  84 year old with past medical history relevant for COPD, coronary artery disease status post CABG (cardiac catheterization in 2017 showed patent grafts), chronic systolic heart failure (most recent echo 10/01/2017 showed EF of 5055%, moderate MR, moderate to severe TR, moderate left atrial enlargement), sick sinus syndrome status post pacemaker, paroxysmal atrial fibrillation on rivaroxaban, peripheral vascular disease status post aortobifemoral bypass, bilateral iliac stents with chronic occlusion of right SFA stent who presented to [facility] with severe leg pain and chest pain and was found to be in atrial fibrillation with RVR as well as have an elevated troponin.   Assessment & Plan:   Principal Problem:   Atrial fibrillation with RVR (HCC) Active Problems:   Peripheral arterial disease (HCC)   Chest pain   COPD (chronic obstructive pulmonary disease) (HCC)   Hypothyroidism   Hypertension   Elevated troponin   Intractable pain   CAD (coronary artery disease)   Chronic systolic CHF (congestive heart failure) (HCC)   #) Right leg pain in the setting of peripheral vascular disease status post stenting and surgery: Patient has had ABIs that are quite abnormal.  CT imaging done around office showed patent bilateral iliac stents, chronic occlusion of right SFA stent, patent left femoropopliteal bypass graft . -Vascular surgery consult, currently they feel that her symptoms are not related to her peripheral vascular disease -Doppler ultrasound ordered for possible DVT - We will consider MRI of the hip to evaluate for possible hip causes of pain -Continue statin -Continue clopidogrel -Pain control  #)low-grade fever: Patient is developed low-grade temperatures of 100.6 200.9.  She has no localizing signs or symptoms. -Chest x-ray clear -Blood  cultures obtained -We will hold on antibiotics -Procalcitonin ordered  #) Chest pain with elevated troponin with coronary artery disease status post CABG: This is felt to be in the setting of demand ischemia due to atrial fibrillation with RVR which was possibly being driven by pain. - Cardiology consult, they recommended no ischemic evaluation at this time however could be considered as an outpatient later -Continue clopidogrel 75 mg - Continue pravastatin 40 mg daily - Continue home metoprolol tartrate 25 mg twice daily -Continue lisinopril 5 mg daily  #) Paroxysmal atrial fibrillation with rapid ventricular response: Improved after diltiazem drip. -Continue diltiazem 60 mg every 6 hours, will consolidate on discharge -Continue metoprolol tartrate 25 mg twice daily - TSH and free T4 reassuring -Continue digoxin 0.125 mg daily -Continue apixaban 5 mg twice daily  #) COPD: -Continue LABA/L AMA/ICS -PRN bronchodilators  #) Hypothyroidism: -Continue levothyroxine 125 mcg daily  #) Anemia: -Vitamin B12 and folate normal - We will send off iron studies  #) Hypertension/hyperlipidemia: -Continue ACE inhibitor, beta-blocker, diltiazem -Continue statin  #) Pain/psych: -Continue gabapentin 100 mg 3 times daily  Fluids: Tolerating p.o. Electrolytes: Monitor and supplement Nutrition: Heart healthy diet  Prophylaxis: On apixaban  Disposition: Pending evaluation of leg pain  Full code   Consultants:   Vascular surgery  Cardiology  Procedures:  ABI 03/21/2018:ABI's have been completed. Right 0.33 Left 0.39      Antimicrobials:   None   Subjective: Patient reports she is doing fairly well except for her leg pain.  She primarily localizes it to her groin and into pattern primarily over the posterior portion of her calf.  She also has noted some right lower extremity swelling.  She otherwise denies  any nausea, cough, fever, diarrhea.  She denies any palpitations or  chest pain.  Objective: Vitals:   03/21/18 0612 03/21/18 0755 03/21/18 0813 03/21/18 1141  BP: (!) 116/58  (!) 113/42 (!) 115/53  Pulse:  81 78 70  Resp:   14 13  Temp:   98.1 F (36.7 C)   TempSrc:   Oral   SpO2:    97%  Weight:      Height:        Intake/Output Summary (Last 24 hours) at 03/21/2018 1207 Last data filed at 03/20/2018 2027 Gross per 24 hour  Intake 642.79 ml  Output 1050 ml  Net -407.21 ml   Filed Weights   03/19/18 0351 03/20/18 0427 03/21/18 0443  Weight: 69.7 kg (153 lb 9.6 oz) 68.2 kg (150 lb 4.8 oz) 71.4 kg (157 lb 4.8 oz)    Examination:  General exam: Appears calm and comfortable  Respiratory system: Clear to auscultation.  Scattered rhonchi, diminished lung sounds at bases Cardiovascular system: Irregularly irregular, no murmurs Gastrointestinal system: Soft, nondistended, no rebound or guarding, plus bowel sounds Central nervous system: Alert and oriented. No focal neurological deficits. Extremities: Unable to palpate pulses in bilateral lower extremities, slow cap refill, groin bilaterally noted to have femoral pulses, no inguinal lymphadenopathy appreciated Skin: Small and bite on tip of toes Psychiatry: Judgement and insight appear normal. Mood & affect appropriate.     Data Reviewed: I have personally reviewed following labs and imaging studies  CBC: Recent Labs  Lab 03/18/18 2103 03/19/18 0829 03/20/18 0508 03/21/18 0814  WBC 7.5 10.2 7.3 7.0  NEUTROABS 5.2  --   --   --   HGB 9.7* 10.0* 8.3* 9.2*  HCT 31.3* 31.9* 26.4* 30.0*  MCV 104.7* 102.6* 102.7* 104.5*  PLT 164 176 158 585   Basic Metabolic Panel: Recent Labs  Lab 03/18/18 2103 03/19/18 0829 03/20/18 0508 03/21/18 0814  NA 139 137 138 133*  K 4.1 4.2 3.8 4.2  CL 109 108 108 103  CO2 21* 18* 24 24  GLUCOSE 116* 96 101* 95  BUN 12 12 10 11   CREATININE 0.96 0.92 0.86 0.81  CALCIUM 7.7* 8.0* 7.8* 8.0*  MG  --   --  1.7 1.8   GFR: Estimated Creatinine Clearance:  51.4 mL/min (by C-G formula based on SCr of 0.81 mg/dL). Liver Function Tests: Recent Labs  Lab 03/18/18 2103 03/20/18 0508  AST 19 19  ALT 12* 13*  ALKPHOS 29* 25*  BILITOT 0.6 0.6  PROT 5.1* 4.7*  ALBUMIN 3.0* 2.7*   No results for input(s): LIPASE, AMYLASE in the last 168 hours. No results for input(s): AMMONIA in the last 168 hours. Coagulation Profile: Recent Labs  Lab 03/18/18 2247  INR 1.25   Cardiac Enzymes: Recent Labs  Lab 03/18/18 2103 03/19/18 0254 03/19/18 1119 03/21/18 0814  CKTOTAL  --   --   --  112  TROPONINI 0.16* 0.17* 0.14*  --    BNP (last 3 results) No results for input(s): PROBNP in the last 8760 hours. HbA1C: No results for input(s): HGBA1C in the last 72 hours. CBG: No results for input(s): GLUCAP in the last 168 hours. Lipid Profile: No results for input(s): CHOL, HDL, LDLCALC, TRIG, CHOLHDL, LDLDIRECT in the last 72 hours. Thyroid Function Tests: Recent Labs    03/20/18 0508  TSH 1.203  FREET4 1.35   Anemia Panel: Recent Labs    03/21/18 0814  VITAMINB12 1,211*  FOLATE 14.2   Sepsis Labs:  Recent Labs  Lab 03/21/18 0814  PROCALCITON <0.10    Recent Results (from the past 240 hour(s))  MRSA PCR Screening     Status: None   Collection Time: 03/18/18 11:17 PM  Result Value Ref Range Status   MRSA by PCR NEGATIVE NEGATIVE Final    Comment:        The GeneXpert MRSA Assay (FDA approved for NASAL specimens only), is one component of a comprehensive MRSA colonization surveillance program. It is not intended to diagnose MRSA infection nor to guide or monitor treatment for MRSA infections. Performed at [facility], [place] [address], [place], [place] [address]          Radiology Studies: Dg Chest Port 1 View  Result Date: 03/21/2018 CLINICAL DATA:  Cough. EXAM: PORTABLE CHEST 1 VIEW COMPARISON:  03/18/2018 and older exams. FINDINGS: There stable changes from prior CABG surgery. Left anterior chest wall  sequential pacemaker and loop recorder are stable. No mediastinal or hilar masses. Lungs are hyperexpanded. There are prominent bronchovascular markings most evident at the bases. No evidence of pneumonia or pulmonary edema. No pleural effusion or pneumothorax. Skeletal structures are demineralized but grossly intact. IMPRESSION: No acute cardiopulmonary disease. Electronically Signed   By: Lajean Manes M.D.   On: 03/21/2018 10:08        Scheduled Meds: . apixaban  5 mg Oral BID  . digoxin  0.125 mg Oral Daily  . diltiazem  60 mg Oral Q6H  . fluticasone furoate-vilanterol  1 puff Inhalation Daily  . gabapentin  100 mg Oral TID  . levothyroxine  125 mcg Oral QAC breakfast  . metoprolol tartrate  25 mg Oral BID  . polyethylene glycol  17 g Oral BID  . senna-docusate  2 tablet Oral BID  . sodium chloride flush  3 mL Intravenous Q12H  . tamoxifen  20 mg Oral Daily  . vitamin B-12  1,000 mcg Oral QHS   Continuous Infusions: . sodium chloride       LOS: 3 days    Time spent: [place], MD [facility]  If 7PM-7AM, please contact night-coverage [email or web address] Password [facility] 03/21/2018, 12:07 PM

## 2018-03-21 NOTE — Evaluation (Signed)
Occupational Therapy Evaluation Patient Details Name: Rita Holmes. Rita Holmes MRN: [id number] DOB: 1931/08/07 Today's Date: 03/21/2018    History of Present Illness 84 y.o. female with a hx of COPD, CAD s/p CABG, PAD s/p B iliac stents and a left fem-pop bypass, hypothyroidism, and chronic systolic CHF who presented to the ED w/ severe right leg pain for 3 days. She also complained of chest pain with mild shortness of breath and nausea.     Clinical Impression   PTA, pt reports that she was at [facility] and required assistance for ambulation, LB ADL, and showering. She currently requires mod assist for simulated ambulating toilet transfers and max assist for LB ADL and toileting hygiene. She additionally presents with slight confusion during session and unsure of baseline cognitive status. Pt would benefit from continued OT services while admitted to improve independence and safety with ADL and functional mobility. Recommend continued rehabilitation at SNF level post-acute D/C.     Follow Up Recommendations  SNF;Supervision/Assistance - 24 hour    Equipment Recommendations  Other (comment)(defer to next venue of care)    Recommendations for Other Services       Precautions / Restrictions Precautions Precautions: Fall Precaution Comments: pt unstable on feet Restrictions Weight Bearing Restrictions: No      Mobility Bed Mobility Overal bed mobility: Needs Assistance Bed Mobility: Supine to Sit     Supine to sit: Mod assist     General bed mobility comments: mod assist to manage RLE as well as to raise trunk from [facility]  Transfers Overall transfer level: Needs assistance Equipment used: Rolling walker (2 wheeled) Transfers: Sit to/from Stand Sit to Stand: Mod assist         General transfer comment: Assist to power up from standing and to stabilize.     Balance Overall balance assessment: Needs assistance Sitting-balance support: No upper extremity supported;Feet supported Sitting  balance-Leahy Scale: Fair Sitting balance - Comments: Min guard assist at EOB   Standing balance support: No upper extremity supported;During functional activity Standing balance-Leahy Scale: Poor Standing balance comment: Min to mod assist to maintain stability during ambulation.                            ADL either performed or assessed with clinical judgement   ADL Overall ADL's : Needs assistance/impaired Eating/Feeding: Set up;Sitting   Grooming: Set up;Sitting   Upper Body Bathing: Set up;Sitting   Lower Body Bathing: Sit to/from stand;Maximal assistance   Upper Body Dressing : Set up;Sitting   Lower Body Dressing: Maximal assistance;Sit to/from stand   Toilet Transfer: Moderate assistance;Ambulation;RW Toilet Transfer Details (indicate cue type and reason): mod assist for stability with RW Toileting- Clothing Manipulation and Hygiene: Maximal assistance;Sit to/from stand       Functional mobility during ADLs: Moderate assistance;Rolling walker General ADL Comments: Pt with modest instability during functional mobility.      Vision Patient Visual Report: No change from baseline Vision Assessment?: No apparent visual deficits     Perception     Praxis      Pertinent Vitals/Pain Pain Assessment: Faces Faces Pain Scale: Hurts little more Pain Location: R groin Pain Descriptors / Indicators: Aching;Discomfort Pain Intervention(s): Limited activity within patient's tolerance;Monitored during session;Repositioned     Hand Dominance     Extremity/Trunk Assessment Upper Extremity Assessment Upper Extremity Assessment: Generalized weakness   Lower Extremity Assessment Lower Extremity Assessment: Defer to PT evaluation  Communication Communication Communication: No difficulties   Cognition Arousal/Alertness: Awake/alert Behavior During Therapy: WFL for tasks assessed/performed Overall Cognitive Status: No family/caregiver present to  determine baseline cognitive functioning Area of Impairment: Attention;Memory;Following commands;Safety/judgement;Awareness;Orientation                 Orientation Level: (pt using environmental cues) Current Attention Level: Selective Memory: Decreased short-term memory Following Commands: Follows multi-step commands inconsistently Safety/Judgement: Decreased awareness of safety;Decreased awareness of deficits Awareness: Emergent   General Comments: Pt with some noted confusion during session.    General Comments       Exercises     Shoulder Instructions      Home Living Family/patient expects to be discharged to:: Skilled nursing facility                                 Additional Comments: Pt reports that she has been at [facility] rehabilitation      Prior Functioning/Environment Level of Independence: Needs assistance  Gait / Transfers Assistance Needed: ambulating with RW with w/c follow per pt ADL's / Homemaking Assistance Needed: assistance for LB ADL and showering            OT Problem List: Decreased strength;Decreased range of motion;Decreased activity tolerance;Decreased safety awareness;Decreased knowledge of use of DME or AE;Decreased knowledge of precautions;Pain      OT Treatment/Interventions: Self-care/ADL training;Therapeutic exercise;Energy conservation;DME and/or AE instruction;Therapeutic activities;Patient/family education;Balance training    OT Goals(Current goals can be found in the care plan section) Acute Rehab OT Goals Patient Stated Goal: to have less pain OT Goal Formulation: With patient Time For Goal Achievement: 04/04/18 Potential to Achieve Goals: Good ADL Goals Pt Will Perform Grooming: with min guard assist;standing Pt Will Perform Lower Body Bathing: with min assist;with adaptive equipment;sit to/from stand Pt Will Perform Lower Body Dressing: with min assist;sit to/from stand;with adaptive equipment Pt Will  Transfer to Toilet: ambulating;with min guard assist;regular height toilet Pt Will Perform Toileting - Clothing Manipulation and hygiene: with min guard assist;sit to/from stand  OT Frequency: Min 2X/week   Barriers to D/C:            Co-evaluation              AM-PAC PT "6 Clicks" Daily Activity     Outcome Measure Help from another person eating meals?: None Help from another person taking care of personal grooming?: A Little Help from another person toileting, which includes using toliet, bedpan, or urinal?: A Little Help from another person bathing (including washing, rinsing, drying)?: A Lot Help from another person to put on and taking off regular upper body clothing?: A Little Help from another person to put on and taking off regular lower body clothing?: A Lot 6 Click Score: 17   End of Session Equipment Utilized During Treatment: Gait belt;Rolling walker Nurse Communication: Mobility status  Activity Tolerance: Patient tolerated treatment well Patient left: with call bell/phone within reach;in chair;with chair alarm set  OT Visit Diagnosis: Other abnormalities of gait and mobility (R26.89);Pain Pain - Right/Left: Right Pain - part of body: Leg                Time: 1138-1200 OT Time Calculation (min): 22 min Charges:  OT General Charges $OT Visit: 1 Visit OT Evaluation $OT Eval Moderate Complexity: 1 Mod G-Codes:     Norman Herrlich, MS OTR/L  Pager: Gregg A Dagen Beevers 03/21/2018, 1:11 PM

## 2018-03-21 NOTE — Plan of Care (Signed)
  Problem: Nutrition: Goal: Adequate nutrition will be maintained Outcome: Progressing   

## 2018-03-21 NOTE — Evaluation (Signed)
Physical Therapy Evaluation Patient Details Name: Rita Holmes. Rita Holmes MRN: [id number] DOB: [birth year] Today's Date: 03/21/2018   History of Present Illness  84 y.o. female with a hx of COPD, CAD s/p CABG, PAD s/p B iliac stents and a left fem-pop bypass, hypothyroidism, and chronic systolic CHF who presented to the ED w/ severe right leg pain for 3 days. She also complained of chest pain with mild shortness of breath and nausea.    Clinical Impression   Pt admitted with above diagnosis. Pt currently with functional limitations due to the deficits listed below (see PT Problem List). Pt lives at a SNF in [place] per her daughter; Recommend return to SNF, and PT follow up; Also worth looking into [facility] Nursing at [facility];  Pt will benefit from skilled PT to increase their independence and safety with mobility to allow discharge to the venue listed below.       Follow Up Recommendations SNF    Equipment Recommendations  Rolling walker with 5" wheels;3in1 (PT)    Recommendations for Other Services       Precautions / Restrictions Precautions Precautions: Fall Precaution Comments: pt unstable on feet Restrictions Weight Bearing Restrictions: No      Mobility  Bed Mobility Overal bed mobility: Needs Assistance Bed Mobility: Supine to Sit     Supine to sit: Mod assist     General bed mobility comments: mod assist to manage RLE as well as to raise trunk from [facility]  Transfers Overall transfer level: Needs assistance Equipment used: Rolling walker (2 wheeled) Transfers: Sit to/from Stand Sit to Stand: Mod assist         General transfer comment: Assist to power up from standing and to stabilize.   Ambulation/Gait Ambulation/Gait assistance: Min guard Gait Distance (Feet): 30 Feet Assistive device: Rolling walker (2 wheeled) Gait Pattern/deviations: Step-through pattern;Decreased step length - right;Decreased step length - left;Decreased stride length Gait velocity: slowed    General Gait Details: Cues to self-monitor for activity tolerance; she seemed pleased with herself  Stairs            Wheelchair Mobility    Modified Rankin (Stroke Patients Only)       Balance Overall balance assessment: Needs assistance Sitting-balance support: No upper extremity supported;Feet supported Sitting balance-Leahy Scale: Fair Sitting balance - Comments: Min guard assist at EOB   Standing balance support: No upper extremity supported;During functional activity Standing balance-Leahy Scale: Poor Standing balance comment: Min to mod assist to maintain stability during ambulation.                              Pertinent Vitals/Pain Pain Assessment: Faces Faces Pain Scale: Hurts little more Pain Location: R groin Pain Descriptors / Indicators: Aching;Discomfort Pain Intervention(s): Monitored during session    Home Living Family/patient expects to be discharged to:: Skilled nursing facility                 Additional Comments: Pt reports that she has been at [facility] rehabilitation    Prior Function Level of Independence: Needs assistance   Gait / Transfers Assistance Needed: ambulating with RW with w/c follow per pt  ADL's / Homemaking Assistance Needed: assistance for LB ADL and showering        Hand Dominance        Extremity/Trunk Assessment   Upper Extremity Assessment Upper Extremity Assessment: Generalized weakness    Lower Extremity Assessment Lower Extremity Assessment: Generalized weakness;RLE deficits/detail  RLE Deficits / Details: Pt is hesitant to move RLE, asking for assistance to move it to the side of the bed to get up RLE: Unable to fully assess due to pain(or pt's anticipation of pain)       Communication   Communication: No difficulties  Cognition Arousal/Alertness: Awake/alert Behavior During Therapy: WFL for tasks assessed/performed Overall Cognitive Status: No family/caregiver present to determine  baseline cognitive functioning Area of Impairment: Attention;Memory;Following commands;Safety/judgement;Awareness;Orientation                 Orientation Level: (pt using environmental cues) Current Attention Level: Selective Memory: Decreased short-term memory Following Commands: Follows multi-step commands inconsistently Safety/Judgement: Decreased awareness of safety;Decreased awareness of deficits Awareness: Emergent   General Comments: Pt with some noted confusion during session.       General Comments General comments (skin integrity, edema, etc.): VSS    Exercises     Assessment/Plan    PT Assessment Patient needs continued PT services  PT Problem List Decreased strength;Decreased range of motion;Decreased activity tolerance;Decreased balance;Decreased mobility;Decreased coordination;Decreased cognition;Decreased knowledge of use of DME;Decreased safety awareness;Decreased knowledge of precautions;Pain       PT Treatment Interventions DME instruction;Gait training;Functional mobility training;Therapeutic activities;Therapeutic exercise;Balance training;Cognitive remediation;Patient/family education    PT Goals (Current goals can be found in the Care Plan section)  Acute Rehab PT Goals Patient Stated Goal: to have less pain PT Goal Formulation: With patient Time For Goal Achievement: 04/04/18 Potential to Achieve Goals: Good    Frequency Min 2X/week   Barriers to discharge        Co-evaluation               AM-PAC PT "6 Clicks" Daily Activity  Outcome Measure Difficulty turning over in bed (including adjusting bedclothes, sheets and blankets)?: Unable Difficulty moving from lying on back to sitting on the side of the bed? : Unable Difficulty sitting down on and standing up from a chair with arms (e.g., wheelchair, bedside commode, etc,.)?: Unable Help needed moving to and from a bed to chair (including a wheelchair)?: A Little Help needed walking in  hospital room?: A Little Help needed climbing 3-5 steps with a railing? : A Lot 6 Click Score: 11    End of Session Equipment Utilized During Treatment: Gait belt Activity Tolerance: Patient tolerated treatment well Patient left: Other (comment)(On BSC) Nurse Communication: Mobility status;Other (comment)(on BSC at end of PT session) PT Visit Diagnosis: Unsteadiness on feet (R26.81);Other abnormalities of gait and mobility (R26.89);Pain Pain - Right/Left: Right Pain - part of body: Leg    Time: 5784-6962 PT Time Calculation (min) (ACUTE ONLY): 20 min   Charges:   PT Evaluation $PT Eval Moderate Complexity: 1 Mod     PT G Codes:        Roney Marion, PT  Acute Rehabilitation Services Pager [phone] Office [phone]   [place] 03/21/2018, 1:41 PM

## 2018-03-22 ENCOUNTER — Inpatient Hospital Stay (HOSPITAL_COMMUNITY): Payer: Medicare Other

## 2018-03-22 DIAGNOSIS — R609 Edema, unspecified: Secondary | ICD-10-CM

## 2018-03-22 LAB — CBC
HCT: 30 % — ABNORMAL LOW (ref 36.0–46.0)
Hemoglobin: 9.2 g/dL — ABNORMAL LOW (ref 12.0–15.0)
MCH: 31.7 pg (ref 26.0–34.0)
MCHC: 30.7 g/dL (ref 30.0–36.0)
MCV: 103.4 fL — ABNORMAL HIGH (ref 78.0–100.0)
Platelets: 164 10*3/uL (ref 150–400)
RBC: 2.9 MIL/uL — ABNORMAL LOW (ref 3.87–5.11)
RDW: 15.9 % — ABNORMAL HIGH (ref 11.5–15.5)
WBC: 6.7 K/uL (ref 4.0–10.5)

## 2018-03-22 LAB — BASIC METABOLIC PANEL
Anion gap: 9 (ref 5–15)
BUN: 12 mg/dL (ref 6–20)
Chloride: 106 mmol/L (ref 101–111)
Creatinine, Ser: 0.82 mg/dL (ref 0.44–1.00)
GFR calc non Af Amer: >60 mL/min (ref >60)
Glucose, Bld: 103 mg/dL — ABNORMAL HIGH (ref 65–99)
Potassium: 4.3 mmol/L (ref 3.5–5.1)

## 2018-03-22 LAB — BASIC METABOLIC PANEL WITH GFR
CO2: 22 mmol/L (ref 22–32)
Calcium: 8.3 mg/dL — ABNORMAL LOW (ref 8.9–10.3)
GFR calc Af Amer: >60 mL/min (ref >60)
Sodium: 137 mmol/L (ref 135–145)

## 2018-03-22 LAB — T3, FREE: T3 FREE: 2 pg/mL (ref 2.0–4.4)

## 2018-03-22 LAB — MAGNESIUM: Magnesium: 1.9 mg/dL (ref 1.7–2.4)

## 2018-03-22 LAB — PROCALCITONIN: Procalcitonin: <0.1 ng/mL

## 2018-03-22 MED ORDER — APIXABAN 5 MG PO TABS
5.0000 mg | ORAL_TABLET | Freq: Two times a day (BID) | ORAL | Status: DC
Start: 2018-03-29 — End: 2018-03-23

## 2018-03-22 MED ORDER — APIXABAN 5 MG PO TABS
10.0000 mg | ORAL_TABLET | Freq: Two times a day (BID) | ORAL | Status: DC
  Administered 2018-03-22 – 2018-03-23 (×2): 10 mg via ORAL
  Filled 2018-03-22 (×2): qty 2

## 2018-03-22 MED ORDER — APIXABAN 5 MG PO TABS
5.0000 mg | ORAL_TABLET | Freq: Once | ORAL | Status: AC
  Administered 2018-03-22: 5 mg via ORAL
  Filled 2018-03-22: qty 1

## 2018-03-22 MED ORDER — DILTIAZEM HCL ER COATED BEADS 240 MG PO CP24
240.0000 mg | ORAL_CAPSULE | Freq: Every day | ORAL | Status: DC
  Administered 2018-03-22 – 2018-03-23 (×2): 240 mg via ORAL
  Filled 2018-03-22 (×2): qty 1

## 2018-03-22 NOTE — Progress Notes (Addendum)
Progress Note  Patient Name: Rita Holmes. Eulas Post Date of Encounter: 03/22/2018  Primary Cardiologist: Dr. Otho Perl  Subjective   Pt reports breathing better. Denies chest pain. HR more stable today 80-90's.   Inpatient Medications    Scheduled Meds: . apixaban  5 mg Oral BID  . clopidogrel  75 mg Oral Daily  . digoxin  0.125 mg Oral Daily  . diltiazem  60 mg Oral Q6H  . fluticasone  2 spray Each Nare Daily  . fluticasone furoate-vilanterol  1 puff Inhalation Daily  . gabapentin  100 mg Oral TID  . levothyroxine  125 mcg Oral QAC breakfast  . lisinopril  5 mg Oral Daily  . metoprolol tartrate  25 mg Oral BID  . polyethylene glycol  17 g Oral BID  . pravastatin  40 mg Oral QHS  . senna-docusate  2 tablet Oral BID  . sodium chloride flush  3 mL Intravenous Q12H  . tamoxifen  20 mg Oral Daily  . traZODone  25 mg Oral QHS  . vitamin B-12  1,000 mcg Oral QHS   Continuous Infusions: . sodium chloride     PRN Meds: sodium chloride, acetaminophen, albuterol, diphenhydrAMINE, diphenhydrAMINE-zinc acetate, fentaNYL (SUBLIMAZE) injection, magnesium citrate, magnesium hydroxide, nitroGLYCERIN, ondansetron (ZOFRAN) IV, oxyCODONE, sodium chloride flush, traMADol   Vital Signs    Vitals:   03/21/18 2237 03/22/18 0044 03/22/18 0412 03/22/18 0642  BP: (!) 110/43 98/60 (!) 109/53 97/75  Pulse: 74 78 74   Resp:  20    Temp:  98.4 F (36.9 C) 98.6 F (37 C)   TempSrc:  Oral Oral   SpO2:  97% 97%   Weight:   162 lb 0.6 oz (73.5 kg)   Height:        Intake/Output Summary (Last 24 hours) at 03/22/2018 0648 Last data filed at 03/22/2018 0316 Gross per 24 hour  Intake 828 ml  Output 750 ml  Net 78 ml   Filed Weights   03/20/18 0427 03/21/18 0443 03/22/18 0412  Weight: 150 lb 4.8 oz (68.2 kg) 157 lb 4.8 oz (71.4 kg) 162 lb 0.6 oz (73.5 kg)    Physical Exam   General: Frail, elderly,  NAD Skin: Warm, dry, intact  Head: Normocephalic, atraumatic, moist mucus membranes. Neck:  Negative for carotid bruits. No JVD Lungs: Bilateral expiratory wheezing and rhonchi. Breathing is unlabored. Cardiovascular: Irregularly irregular with S1 S2. No murmurs, rubs or gallops.  Abdomen: Soft, non-tender, non-distended with normoactive bowel sounds. No obvious abdominal masses. MSK: Strength and tone appear normal for age. 5/5 in all extremities Extremities: No edema. No clubbing or cyanosis. DP/PT pulses 2+ bilaterally Neuro: Alert and oriented. No focal deficits. No facial asymmetry. MAE spontaneously. Psych: Responds to questions appropriately with normal affect.    Labs    Chemistry Recent Labs  Lab 03/18/18 2103  03/20/18 0508 03/21/18 0814 03/22/18 0556  NA 139   < > 138 133* 137  K 4.1   < > 3.8 4.2 4.3  CL 109   < > 108 103 106  CO2 21*   < > 24 24 22   GLUCOSE 116*   < > 101* 95 103*  BUN 12   < > 10 11 12   CREATININE 0.96   < > 0.86 0.81 0.82  CALCIUM 7.7*   < > 7.8* 8.0* 8.3*  PROT 5.1*  --  4.7*  --   --   ALBUMIN 3.0*  --  2.7*  --   --  AST 19  --  19  --   --   ALT 12*  --  13*  --   --   ALKPHOS 29*  --  25*  --   --   BILITOT 0.6  --  0.6  --   --   GFRNONAA 52*   < > 60* >60 >60  GFRAA >60   < > >60 >60 >60  ANIONGAP 9   < > 6 6 9    < > = values in this interval not displayed.    Hematology Recent Labs  Lab 03/20/18 0508 03/21/18 0814 03/22/18 0556  WBC 7.3 7.0 6.7  RBC 2.57* 2.87* 2.90*  HGB 8.3* 9.2* 9.2*  HCT 26.4* 30.0* 30.0*  MCV 102.7* 104.5* 103.4*  MCH 32.3 32.1 31.7  MCHC 31.4 30.7 30.7  RDW 16.0* 16.1* 15.9*  PLT 158 165 164   Cardiac Enzymes Recent Labs  Lab 03/18/18 2103 03/19/18 0254 03/19/18 1119  TROPONINI 0.16* 0.17* 0.14*    Recent Labs  Lab 03/18/18 1529  TROPIPOC 0.16*    BNPNo results for input(s): BNP, PROBNP in the last 168 hours.   DDimer No results for input(s): DDIMER in the last 168 hours.   Radiology    Dg Chest Port 1 View  Result Date: 03/21/2018 CLINICAL DATA:  Cough. EXAM: PORTABLE  CHEST 1 VIEW COMPARISON:  03/18/2018 and older exams. FINDINGS: There stable changes from prior CABG surgery. Left anterior chest wall sequential pacemaker and loop recorder are stable. No mediastinal or hilar masses. Lungs are hyperexpanded. There are prominent bronchovascular markings most evident at the bases. No evidence of pneumonia or pulmonary edema. No pleural effusion or pneumothorax. Skeletal structures are demineralized but grossly intact. IMPRESSION: No acute cardiopulmonary disease. Electronically Signed   By: Lajean Manes M.D.   On: 03/21/2018 10:08    Telemetry    03/22/18 AF HR 80-90's - Personally Reviewed  ECG     03/20/18 Atrial fibrillation HR 89 RBBB- Personally Reviewed  Cardiac Studies   Left Heart Catheterization 11/2015: Conclusions Diagnostic Procedure Summary Multivessel CAD. SVG OM, SVG Ramus, LIMA LAD patent without significant lesions Normal LV function No target for intervention identified; no significant AV gradient noted Diagnostic Procedure Recommendations Recommend work up for non-cardiac causes of symptoms.  Echocardiogram 11/2017: Conclusions Summary Low Normal left ventricular systolic function with no appreciable segmental abnormality. Ejection fraction is visually estimated at 50-55% Moderate mitral regurgitation. Moderate/Severe tricuspid regurgitation. RVSP 42 mm Hg. Moderate Left Atrial enlargement  Patient Profile     84 y.o. female with a PMH of CAD s/p CABG 1998 (last LHC 2017 with patent grafts), chronic systolic CHF, PAF on xarelto, PVD s/p b/l iliac stents and L fem-pop bypass, SSS s/p PPM, hypothyroidism, and COPD, who is being followed by cardiology  for the evaluation of chest pain at the request of Dr. Thereasa Solo.  Assessment & Plan    1. Chest pain s/p CABG with patent grafts per cath 2017: -Pt presented with non-exertional CP with associated SOB and radiation to her back -Trop, 0.16>0.17>0.14>>>likely in the setting of  AF>>demand ischemia -Denies recurrent CP -EKG with AF, HR 89 with RBBB -Last echo 11/2017 with stable EF 50-55%, moderate MR, and mod-severe TR -Last LHC 2017 with patent grafts -Continue ASA, Plavix, statin, BB   2. Atrial fibrillation with RVR: -HR remains mildly elevated>80-90's -Continues to have palpitations, no chest pain  -Diltiazem gtt off, now transitioned to diltiazem 60 mg PO -TSH, 1.203 -Was on home  dose Xarelto for paroxymal AF, however last home dose unknown>>>started on Eliquis this admission   -Continue Eliquis 5mg , digoxin 0.125mg  -CHA2DS2VASc =5 (CHF, vascular, female, age >19yo)  3. Severe RLE pain with known hx of PVD: -PVD s/p b/l iliac stents and chronically occluded R SFA -VVS following>>with ABI's showing chronic moderate to severe arterial insufficiency bilateral lower extremities with no recommendations for invasive testing at this time per chart review. -Management per primary and VVS  Signed, Kathyrn Drown NP-C [facility] Pager: [phone] 03/22/2018, 6:48 AM     For questions or updates, please contact   Please consult [email or web address] for contact info under Cardiology/STEMI.  As above, patient seen and examined.  She denies chest pain this morning.  Her leg pain has resolved as well.  Her blood pressure is borderline but heart rate well controlled.  We will continue with digoxin and metoprolol for rate control.  Change Cardizem to CD.  As her blood pressure is borderline I will discontinue lisinopril. Continue apixaban 5 mg BID at DC. Note her last echocardiogram showed preserved LV function.  She also has mild aortic stenosis and moderate mitral regurgitation.  She will need follow-up echoes in the future.  Plan medical therapy for peripheral vascular disease per vascular surgery.  Her troponin was minimally elevated at time of admission but there is no clear trend and this is felt secondary to demand ischemia.  Last catheterization revealed patent grafts.   Plan medical therapy.  Patient can be discharged from a cardiac standpoint and follow-up with Dr. Otho Perl. Will need fu ultrasound for AAA in one year. We will sign off. Please call with questions.  Kirk Ruths MD

## 2018-03-22 NOTE — Progress Notes (Signed)
RLE venous duplex prelim: Intramuscular DVT noted in the soleal veins. Landry Mellow, RDMS, RVT   Called results to Santiago Glad, [place]

## 2018-03-22 NOTE — Clinical Social Work Note (Signed)
Clinical Social Work Assessment  Patient Details  Name: Rita Holmes MRN: [id number] Date of Birth: 06-Jan-1931  Date of referral:  03/22/18               Reason for consult:  Facility Placement, Discharge Planning                Permission sought to share information with:  Facility Sport and exercise psychologist, Family Supports Permission granted to share information::  Yes, Verbal Permission Granted(patient asleep, family at bedside)  Name::     Delice Lesch  Agency::  [facility]  Relationship::  son  Contact Information:  [phone]  Housing/Transportation Living arrangements for the past 2 months:  [place] of Information:  Adult Children Patient Interpreter Needed:  None Criminal Activity/Legal Involvement Pertinent to Current Situation/Hospitalization:  No - Comment as needed Significant Relationships:  Adult Children Lives with:  Facility Resident Do you feel safe going back to the place where you live?  Yes Need for family participation in patient care:  Yes (Comment)  Care giving concerns: Patient is long term care patient at [facility].   Social Worker assessment / plan: CSW met with patient's son, Delfino Lovett, and daughter in law at bedside. Patient was asleep. CSW introduced self and role. Son reported patient is a long term care patient at [facility] and also has Medicaid in addition to her Medicare. She has lived at the facility for several months. Family agreeable for patient to return to the facility.  CSW spoke with admissions at [facility] and they are agreeable for patient to return. CSW sent clinical updates to facility. CSW to follow and support with discharge planning.  Employment status:  Retired Forensic scientist:  [employer] PT Recommendations:  [place] / Referral to community resources:  [place]  Patient/Family's Response to care: Family appreciative of  care.  Patient/Family's Understanding of and Emotional Response to Diagnosis, Current Treatment, and Prognosis: Family with understanding of patient's condition and agreeable for patient to return to her SNF.  Emotional Assessment Appearance:  Appears stated age Attitude/Demeanor/Rapport:  Unable to Assess Affect (typically observed):  Unable to Assess Orientation:  Oriented to Self, Oriented to Place, Oriented to  Time, Oriented to Situation Alcohol / Substance use:  Not Applicable Psych involvement (Current and /or in the community):  No (Comment)  Discharge Needs  Concerns to be addressed:  Discharge Planning Concerns, Care Coordination Readmission within the last 30 days:  No Current discharge risk:  Physical Impairment Barriers to Discharge:  Continued Medical Work up   Estanislado Emms, LCSW 03/22/2018, 11:30 AM

## 2018-03-22 NOTE — Progress Notes (Signed)
Occupational Therapy Treatment Patient Details Name: Santasia Holmes. Holmes MRN: [id number] DOB: Jul 01, 1931 Today's Date: 03/22/2018    History of present illness 84 y.o. female with a hx of COPD, CAD s/p CABG, PAD s/p B iliac stents and a left fem-pop bypass, hypothyroidism, and chronic systolic CHF who presented to the ED w/ severe right leg pain for 3 days. She also complained of chest pain with mild shortness of breath and nausea.     OT comments  Performed bed mobility, toileting and seated grooming with min assist. Continues to report R groin pain limiting her mobility.   Follow Up Recommendations  SNF;Supervision/Assistance - 24 hour    Equipment Recommendations       Recommendations for Other Services      Precautions / Restrictions Precautions Precautions: Fall       Mobility Bed Mobility Overal bed mobility: Needs Assistance       Supine to sit: Min assist     General bed mobility comments: increased time, assist for hips to EOB and to raise trunk  Transfers Overall transfer level: Needs assistance Equipment used: 1 person hand held assist Transfers: Sit to/from Stand;Stand Pivot Transfers Sit to Stand: Min assist Stand pivot transfers: Min assist       General transfer comment: assist to rise and steady, performec x 2    Balance Overall balance assessment: Needs assistance   Sitting balance-Leahy Scale: Fair     Standing balance support: Bilateral upper extremity supported Standing balance-Leahy Scale: Poor Standing balance comment: requires min assist and B UE support                           ADL either performed or assessed with clinical judgement   ADL Overall ADL's : Needs assistance/impaired     Grooming: Wash/dry hands;Wash/dry face;Sitting;Set up           Upper Body Dressing : Minimal assistance;Sitting   Lower Body Dressing: Maximal assistance Lower Body Dressing Details (indicate cue type and reason): for socks Toilet  Transfer: Minimal assistance;Stand-pivot;BSC   Toileting- Clothing Manipulation and Hygiene: Minimal assistance;Sit to/from stand         General ADL Comments: cues for posture with standing     Vision       Perception     Praxis      Cognition Arousal/Alertness: Awake/alert Behavior During Therapy: WFL for tasks assessed/performed   Area of Impairment: Memory;Problem solving                     Memory: Decreased short-term memory       Problem Solving: Difficulty sequencing;Slow processing          Exercises     Shoulder Instructions       General Comments      Pertinent Vitals/ Pain       Pain Assessment: No/denies pain Faces Pain Scale: Hurts little more Pain Location: R groin Pain Descriptors / Indicators: Aching;Discomfort Pain Intervention(s): Repositioned;Monitored during session  Home Living                                          Prior Functioning/Environment              Frequency  Min 2X/week        Progress Toward Goals  OT Goals(current goals can now  be found in the care plan section)  Progress towards OT goals: Progressing toward goals  Acute Rehab OT Goals Patient Stated Goal: to have less pain OT Goal Formulation: With patient Time For Goal Achievement: 04/04/18 Potential to Achieve Goals: Good  Plan Discharge plan remains appropriate    Co-evaluation                 AM-PAC PT "6 Clicks" Daily Activity     Outcome Measure   Help from another person eating meals?: None Help from another person taking care of personal grooming?: A Little Help from another person toileting, which includes using toliet, bedpan, or urinal?: A Little Help from another person bathing (including washing, rinsing, drying)?: A Lot Help from another person to put on and taking off regular upper body clothing?: A Little Help from another person to put on and taking off regular lower body clothing?: A Lot 6  Click Score: 17    End of Session Equipment Utilized During Treatment: Gait belt;Oxygen  OT Visit Diagnosis: Other abnormalities of gait and mobility (R26.89);Pain Pain - Right/Left: Right Pain - part of body: Leg   Activity Tolerance Patient tolerated treatment well   Patient Left in bed;with call bell/phone within reach;with nursing/sitter in room;with family/visitor present   Nurse Communication          Time: 0321-2248 OT Time Calculation (min): 22 min  Charges: OT General Charges $OT Visit: 1 Visit OT Treatments $Self Care/Home Management : 8-22 mins  03/22/2018 Nestor Lewandowsky, OTR/L Pager: [phone] Rita Holmes Rita Holmes 03/22/2018, 12:25 PM

## 2018-03-22 NOTE — Care Management Important Message (Signed)
Important Message  Patient Details  Name: Rita Holmes MRN: [id number] Date of Birth: 1931-08-23   Medicare Important Message Given:  Yes    Barb Merino [place] 03/22/2018, 3:21 PM

## 2018-03-22 NOTE — NC FL2 (Signed)
[place] LEVEL OF CARE SCREENING TOOL     IDENTIFICATION  Patient Name: Rita Holmes. Rosamilia Birthdate: [birth year] Sex: female Admission Date (Current Location): 03/18/2018  [place] and [place] Number:  Herbalist and Address:  [facility] [place], [place] [address] [place], [place] [address]      Provider Number: [id number]  Attending Physician Name and Address:  Cristy Folks, MD  Relative Name and Phone Number:  Delice Lesch, son, [phone]    Current Level of Care: Hospital Recommended Level of Care: [place] Prior Approval Number:    Date Approved/Denied:   PASRR Number: [id number]  Discharge Plan: SNF    Current Diagnoses: Patient Active Problem List   Diagnosis Date Noted  . Atrial fibrillation with RVR ([place]) 03/18/2018  . Peripheral arterial disease ([place]) 03/18/2018  . Chest pain 03/18/2018  . COPD (chronic obstructive pulmonary disease) ([place]) 03/18/2018  . Hypothyroidism 03/18/2018  . Hypertension 03/18/2018  . Elevated troponin 03/18/2018  . Intractable pain 03/18/2018  . CAD (coronary artery disease) 03/18/2018  . Chronic systolic CHF (congestive heart failure) ([place]) 03/18/2018    Orientation RESPIRATION BLADDER Height & Weight     Self, Time, Situation, Place  O2(nasal cannula 2L) Incontinent Weight: 162 lb 0.6 oz (73.5 kg) Height:  5\' 6"  (167.6 cm)  BEHAVIORAL SYMPTOMS/MOOD NEUROLOGICAL BOWEL NUTRITION STATUS      Continent Diet(please see DC summary)  AMBULATORY STATUS COMMUNICATION OF NEEDS Skin   Limited Assist Verbally PU Stage and Appropriate Care(PU stage I L ankle)                       Personal Care Assistance Level of Assistance  Bathing, Feeding, Dressing Bathing Assistance: Limited assistance Feeding assistance: Independent Dressing Assistance: Limited assistance     Functional Limitations Info  Sight, Hearing, Speech Sight Info: Adequate Hearing Info:  Adequate Speech Info: Adequate    SPECIAL CARE FACTORS FREQUENCY  PT (By licensed PT), OT (By licensed OT)     PT Frequency: 5x/week OT Frequency: 5x/week            Contractures Contractures Info: Not present    Additional Factors Info  Code Status, Allergies, Psychotropic Code Status Info: Full Allergies Info: Atorvastatin, Diclofenac Sodium, Hydrocodone Psychotropic Info: trazadone         Current Medications (03/22/2018):  This is the current hospital active medication list Current Facility-Administered Medications  Medication Dose Route Frequency Provider Last Rate Last Dose  . 0.9 %  sodium chloride infusion  250 mL Intravenous PRN Opyd, Ilene Qua, MD      . acetaminophen (TYLENOL) tablet 650 mg  650 mg Oral Q4H PRN Cherene Altes, MD      . albuterol (PROVENTIL) (2.5 MG/3ML) 0.083% nebulizer solution 2.5 mg  2.5 mg Inhalation Q2H PRN Cherene Altes, MD      . apixaban (ELIQUIS) tablet 10 mg  10 mg Oral BID Purohit, Shrey C, MD      . apixaban (ELIQUIS) tablet 5 mg  5 mg Oral Once Purohit, Konrad Dolores, MD      . Derrill Memo ON 03/29/2018] apixaban (ELIQUIS) tablet 5 mg  5 mg Oral BID Purohit, Shrey C, MD      . clopidogrel (PLAVIX) tablet 75 mg  75 mg Oral Daily Purohit, Shrey C, MD   75 mg at 03/22/18 0900  . digoxin (LANOXIN) tablet 0.125 mg  0.125 mg Oral Daily Kroeger, Daleen Snook M., PA-C  0.125 mg at 03/22/18 0900  . diltiazem (CARDIZEM CD) 24 hr capsule 240 mg  240 mg Oral Daily Lelon Perla, MD      . diphenhydrAMINE (BENADRYL) capsule 25 mg  25 mg Oral Q8H PRN Purohit, Shrey C, MD      . diphenhydrAMINE-zinc acetate (BENADRYL) 2-0.1 % cream   Topical TID PRN Opyd, Ilene Qua, MD      . fentaNYL (SUBLIMAZE) injection 12.5 mcg  12.5 mcg Intravenous Q2H PRN Cherene Altes, MD      . fluticasone (FLONASE) 50 MCG/ACT nasal spray 2 spray  2 spray Each Nare Daily Purohit, Shrey C, MD      . fluticasone furoate-vilanterol (BREO ELLIPTA) 100-25 MCG/INH 1 puff  1 puff  Inhalation Daily Opyd, Ilene Qua, MD   1 puff at 03/22/18 0850  . gabapentin (NEURONTIN) capsule 100 mg  100 mg Oral TID Cherene Altes, MD   100 mg at 03/22/18 0900  . levothyroxine (SYNTHROID, LEVOTHROID) tablet 125 mcg  125 mcg Oral QAC breakfast Cherene Altes, MD   125 mcg at 03/22/18 [phone]  . magnesium citrate solution 1 Bottle  1 Bottle Oral Daily PRN Purohit, Shrey C, MD      . magnesium hydroxide (MILK OF MAGNESIA) suspension 5 mL  5 mL Oral Daily PRN Purohit, Shrey C, MD      . metoprolol tartrate (LOPRESSOR) tablet 25 mg  25 mg Oral BID Cherene Altes, MD   25 mg at 03/22/18 0900  . nitroGLYCERIN (NITROSTAT) SL tablet 0.4 mg  0.4 mg Sublingual Q5 Min x 3 PRN Opyd, Ilene Qua, MD      . ondansetron (ZOFRAN) injection 4 mg  4 mg Intravenous Q6H PRN Opyd, Ilene Qua, MD   4 mg at 03/21/18 1655  . oxyCODONE (Oxy IR/ROXICODONE) immediate release tablet 5-10 mg  5-10 mg Oral Q3H PRN Cherene Altes, MD   5 mg at 03/21/18 1515  . polyethylene glycol (MIRALAX / GLYCOLAX) packet 17 g  17 g Oral BID Purohit, Shrey C, MD   17 g at 03/21/18 2237  . pravastatin (PRAVACHOL) tablet 40 mg  40 mg Oral QHS Purohit, Shrey C, MD   40 mg at 03/21/18 2234  . senna-docusate (Senokot-S) tablet 2 tablet  2 tablet Oral BID Purohit, Konrad Dolores, MD   2 tablet at 03/22/18 0900  . sodium chloride flush (NS) 0.9 % injection 3 mL  3 mL Intravenous Q12H Opyd, Ilene Qua, MD   3 mL at 03/21/18 2258  . sodium chloride flush (NS) 0.9 % injection 3 mL  3 mL Intravenous PRN Opyd, Ilene Qua, MD   3 mL at 03/21/18 0612  . tamoxifen (NOLVADEX) tablet 20 mg  20 mg Oral Daily Cherene Altes, MD   20 mg at 03/22/18 0900  . traMADol (ULTRAM) tablet 50 mg  50 mg Oral Q6H PRN Cherene Altes, MD   50 mg at 03/20/18 2355  . traZODone (DESYREL) tablet 25 mg  25 mg Oral QHS Purohit, Shrey C, MD   25 mg at 03/21/18 2233  . vitamin B-12 (CYANOCOBALAMIN) tablet 1,000 mcg  1,000 mcg Oral QHS Cherene Altes, MD   1,000 mcg  at 03/21/18 2248     Discharge Medications: Please see discharge summary for a list of discharge medications.  Relevant Imaging Results:  Relevant Lab Results:   Additional Information SSN: [id number]  Estanislado Emms, LCSW

## 2018-03-22 NOTE — Progress Notes (Signed)
PROGRESS NOTE    Rita Holmes  QQP:[id number] DOB: Nov 17, 1930 DOA: 03/18/2018 PCP: Dierdre Searles, MD   Brief Narrative:  84 year old with past medical history relevant for COPD, coronary artery disease status post CABG (cardiac catheterization in 2017 showed patent grafts), chronic systolic heart failure (most recent echo 10/01/2017 showed EF of 5055%, moderate MR, moderate to severe TR, moderate left atrial enlargement), sick sinus syndrome status post pacemaker, paroxysmal atrial fibrillation on rivaroxaban, peripheral vascular disease status post aortobifemoral bypass, bilateral iliac stents with chronic occlusion of right SFA stent who presented to [facility] with severe leg pain and chest pain and was found to be in atrial fibrillation with RVR as well as have an elevated troponin.   Assessment & Plan:   Principal Problem:   Atrial fibrillation with RVR (HCC) Active Problems:   Peripheral arterial disease (HCC)   Chest pain   COPD (chronic obstructive pulmonary disease) (HCC)   Hypothyroidism   Hypertension   Elevated troponin   Intractable pain   CAD (coronary artery disease)   Chronic systolic CHF (congestive heart failure) (HCC)   #) Right leg pain due to soleal DVT: Patient does have small distal DVT she has significant pain from this.  Per the chart patient apparently was on rivaroxaban however it is unclear if she had this clot from rivaroxaban or if she was truly on it or not as there is nothing in the medication list.  Will discuss with pharmacy and patient skilled nursing facility.  Patient is also on tamoxifen and so as such we will consider discontinuing this. - We will discuss with heme-onc but discontinue tamoxifen -We will start apixaban 10 mg twice daily for 1 week and then decrease to 5 mg twice daily for both atrial fibrillation and DVT -Pain control  #) Peripheral vascular disease status post stenting and surgery: Patient has had ABIs that are  quite abnormal.  CT imaging done around office showed patent bilateral iliac stents, chronic occlusion of right SFA stent, patent left femoropopliteal bypass graft . -Vascular surgery consult, currently they feel that her symptoms are not related to her peripheral vascular disease -Continue statin -Continue clopidogrel -Pain control  #)low-grade fever: Likely secondary to DVT -Blood cultures from 03/21/2018 no growth to date -Procalcitonin normal  #) Chest pain with elevated troponin with coronary artery disease status post CABG: This is felt to be in the setting of demand ischemia due to atrial fibrillation with RVR which was possibly being driven by pain. - Cardiology consult, they recommended no ischemic evaluation at this time however could be considered as an outpatient later -Continue clopidogrel 75 mg - Continue pravastatin 40 mg daily - Continue home metoprolol tartrate 25 mg twice daily -Continue lisinopril 5 mg daily  #) Paroxysmal atrial fibrillation with rapid ventricular response: Improved after diltiazem drip. -Continue diltiazem 60 mg every 6 hours, will consolidate on discharge -Continue metoprolol tartrate 25 mg twice daily - TSH and free T4 reassuring -Continue digoxin 0.125 mg daily -Continue apixaban   #) COPD: -Continue LABA/L AMA/ICS -PRN bronchodilators  #) Hypothyroidism: -Continue levothyroxine 125 mcg daily  #) Anemia: Stable -Vitamin B12 and folate normal  #) Hypertension/hyperlipidemia: -Continue ACE inhibitor, beta-blocker, diltiazem -Continue statin  #) Pain/psych: -Continue gabapentin 100 mg 3 times daily  Fluids: Tolerating p.o. Electrolytes: Monitor and supplement Nutrition: Heart healthy diet  Prophylaxis: On apixaban  Disposition: Likely back to skilled nursing facility tomorrow  Full code   Consultants:   Vascular surgery  Cardiology  Procedures:  ABI 03/21/2018:ABI's have been completed. Right 0.33 Left  0.39      Antimicrobials:   None   Subjective: Patient reports she is doing fairly well.  She reports her leg pain is improved somewhat.  She is more ambulatory.  She denies any nausea, vomiting, diarrhea. Objective: Vitals:   03/22/18 0044 03/22/18 0412 03/22/18 0642 03/22/18 0900  BP: 98/60 (!) 109/53 97/75 (!) 105/44  Pulse: 78 74  88  Resp: 20     Temp: 98.4 F (36.9 C) 98.6 F (37 C)    TempSrc: Oral Oral    SpO2: 97% 97%    Weight:  73.5 kg (162 lb 0.6 oz)    Height:        Intake/Output Summary (Last 24 hours) at 03/22/2018 1132 Last data filed at 03/22/2018 0316 Gross per 24 hour  Intake 708 ml  Output 750 ml  Net -42 ml   Filed Weights   03/20/18 0427 03/21/18 0443 03/22/18 0412  Weight: 68.2 kg (150 lb 4.8 oz) 71.4 kg (157 lb 4.8 oz) 73.5 kg (162 lb 0.6 oz)    Examination:  General exam: Appears calm and comfortable  Respiratory system: Clear to auscultation.  Scattered rhonchi, diminished lung sounds at bases Cardiovascular system: Irregularly irregular, no murmurs Gastrointestinal system: Soft, nondistended, no rebound or guarding, plus bowel sounds Central nervous system: Alert and oriented. No focal neurological deficits. Extremities: Unable to palpate pulses in bilateral lower extremities, slow cap refill, groin bilaterally noted to have femoral pulses, no inguinal lymphadenopathy appreciated Skin: Small and bite on tip of toes Psychiatry: Judgement and insight appear normal. Mood & affect appropriate.     Data Reviewed: I have personally reviewed following labs and imaging studies  CBC: Recent Labs  Lab 03/18/18 2103 03/19/18 0829 03/20/18 0508 03/21/18 0814 03/22/18 0556  WBC 7.5 10.2 7.3 7.0 6.7  NEUTROABS 5.2  --   --   --   --   HGB 9.7* 10.0* 8.3* 9.2* 9.2*  HCT 31.3* 31.9* 26.4* 30.0* 30.0*  MCV 104.7* 102.6* 102.7* 104.5* 103.4*  PLT 164 176 158 165 657   Basic Metabolic Panel: Recent Labs  Lab 03/18/18 2103 03/19/18 0829  03/20/18 0508 03/21/18 0814 03/22/18 0556  NA 139 137 138 133* 137  K 4.1 4.2 3.8 4.2 4.3  CL 109 108 108 103 106  CO2 21* 18* 24 24 22   GLUCOSE 116* 96 101* 95 103*  BUN 12 12 10 11 12   CREATININE 0.96 0.92 0.86 0.81 0.82  CALCIUM 7.7* 8.0* 7.8* 8.0* 8.3*  MG  --   --  1.7 1.8 1.9   GFR: Estimated Creatinine Clearance: 51.5 mL/min (by C-G formula based on SCr of 0.82 mg/dL). Liver Function Tests: Recent Labs  Lab 03/18/18 2103 03/20/18 0508  AST 19 19  ALT 12* 13*  ALKPHOS 29* 25*  BILITOT 0.6 0.6  PROT 5.1* 4.7*  ALBUMIN 3.0* 2.7*   No results for input(s): LIPASE, AMYLASE in the last 168 hours. No results for input(s): AMMONIA in the last 168 hours. Coagulation Profile: Recent Labs  Lab 03/18/18 2247  INR 1.25   Cardiac Enzymes: Recent Labs  Lab 03/18/18 2103 03/19/18 0254 03/19/18 1119 03/21/18 0814  CKTOTAL  --   --   --  112  TROPONINI 0.16* 0.17* 0.14*  --    BNP (last 3 results) No results for input(s): PROBNP in the last 8760 hours. HbA1C: No results for input(s): HGBA1C in the last 72 hours.  CBG: No results for input(s): GLUCAP in the last 168 hours. Lipid Profile: No results for input(s): CHOL, HDL, LDLCALC, TRIG, CHOLHDL, LDLDIRECT in the last 72 hours. Thyroid Function Tests: Recent Labs    03/20/18 0508  TSH 1.203  FREET4 1.35  T3FREE 2.0   Anemia Panel: Recent Labs    03/21/18 0814  VITAMINB12 1,211*  FOLATE 14.2   Sepsis Labs: Recent Labs  Lab 03/21/18 0814 03/22/18 0556  PROCALCITON <0.10 <0.10    Recent Results (from the past 240 hour(s))  MRSA PCR Screening     Status: None   Collection Time: 03/18/18 11:17 PM  Result Value Ref Range Status   MRSA by PCR NEGATIVE NEGATIVE Final    Comment:        The GeneXpert MRSA Assay (FDA approved for NASAL specimens only), is one component of a comprehensive MRSA colonization surveillance program. It is not intended to diagnose MRSA infection nor to guide or monitor  treatment for MRSA infections. Performed at [facility], [place] [address] [place], [place] [address]          Radiology Studies: Dg Chest Port 1 View  Result Date: 03/21/2018 CLINICAL DATA:  Cough. EXAM: PORTABLE CHEST 1 VIEW COMPARISON:  03/18/2018 and older exams. FINDINGS: There stable changes from prior CABG surgery. Left anterior chest wall sequential pacemaker and loop recorder are stable. No mediastinal or hilar masses. Lungs are hyperexpanded. There are prominent bronchovascular markings most evident at the bases. No evidence of pneumonia or pulmonary edema. No pleural effusion or pneumothorax. Skeletal structures are demineralized but grossly intact. IMPRESSION: No acute cardiopulmonary disease. Electronically Signed   By: Lajean Manes M.D.   On: 03/21/2018 10:08        Scheduled Meds: . apixaban  10 mg Oral BID  . apixaban  5 mg Oral Once  . [START ON 03/29/2018] apixaban  5 mg Oral BID  . clopidogrel  75 mg Oral Daily  . digoxin  0.125 mg Oral Daily  . diltiazem  240 mg Oral Daily  . fluticasone  2 spray Each Nare Daily  . fluticasone furoate-vilanterol  1 puff Inhalation Daily  . gabapentin  100 mg Oral TID  . levothyroxine  125 mcg Oral QAC breakfast  . metoprolol tartrate  25 mg Oral BID  . polyethylene glycol  17 g Oral BID  . pravastatin  40 mg Oral QHS  . senna-docusate  2 tablet Oral BID  . sodium chloride flush  3 mL Intravenous Q12H  . tamoxifen  20 mg Oral Daily  . traZODone  25 mg Oral QHS  . vitamin B-12  1,000 mcg Oral QHS   Continuous Infusions: . sodium chloride       LOS: 4 days    Time spent: [place], MD [facility]  If 7PM-7AM, please contact night-coverage [email or web address] Password [facility] 03/22/2018, 11:32 AM

## 2018-03-23 DIAGNOSIS — L8990 Pressure ulcer of unspecified site, unspecified stage: Secondary | ICD-10-CM

## 2018-03-23 LAB — PROCALCITONIN: Procalcitonin: <0.1 ng/mL

## 2018-03-23 MED ORDER — DILTIAZEM HCL ER COATED BEADS 240 MG PO CP24: 240.0000 mg | ORAL_CAPSULE | Freq: Every day | ORAL | 1 refills | Status: AC

## 2018-03-23 MED ORDER — APIXABAN 5 MG PO TABS: 5.0000 mg | ORAL_TABLET | Freq: Two times a day (BID) | ORAL | 1 refills | Status: AC

## 2018-03-23 MED ORDER — APIXABAN 5 MG PO TABS: 10.0000 mg | ORAL_TABLET | Freq: Two times a day (BID) | ORAL | 0 refills | Status: AC

## 2018-03-23 MED ORDER — TRAMADOL HCL 50 MG PO TABS: 50.0000 mg | ORAL_TABLET | Freq: Four times a day (QID) | ORAL | 0 refills | Status: AC | PRN

## 2018-03-23 NOTE — Progress Notes (Signed)
Report given to Doctor, general practice at [facility] re[facility].

## 2018-03-23 NOTE — Clinical Social Work Placement (Signed)
   CLINICAL SOCIAL WORK PLACEMENT  NOTE  Date:  03/23/2018  Patient Details  Name: Rita Holmes MRN: [id number] Date of Birth: 03/20/1931  Clinical Social Work is seeking post-discharge placement for this patient at the [place] level of care (*CSW will initial, date and re-position this form in  chart as items are completed):  Yes   Patient/family provided with [facility] Department's list of facilities offering this level of care within the geographic area requested by the patient (or if unable, by the patient's family).  Yes   Patient/family informed of their freedom to choose among providers that offer the needed level of care, that participate in Medicare, Medicaid or managed care program needed by the patient, have an available bed and are willing to accept the patient.  Yes   Patient/family informed of [facility]'s ownership interest in [facility] and [facility], as well as of the fact that they are under no obligation to receive care at these facilities.  PASRR submitted to EDS on       PASRR number received on       Existing PASRR number confirmed on 03/22/18     FL2 transmitted to all facilities in geographic area requested by pt/family on 03/22/18     FL2 transmitted to all facilities within larger geographic area on       Patient informed that his/her managed care company has contracts with or will negotiate with certain facilities, including the following:  [facility]     Yes   Patient/family informed of bed offers received.  Patient chooses bed at [facility] and [place] recommends and patient chooses bed at      Patient to be transferred to [facility] on 03/23/18.  Patient to be transferred to facility by [facility]     Patient family notified on 03/23/18 of transfer.  Name of family member notified:  Delice Lesch, son     PHYSICIAN Please prepare priority  discharge summary, including medications, Please prepare prescriptions     Additional Comment:    _______________________________________________ Estanislado Emms, LCSW 03/23/2018, 12:24 PM

## 2018-03-23 NOTE — Progress Notes (Signed)
Patient will discharge back to [facility]. Anticipated discharge date: 03/23/18 Family notified: Delice Lesch, son Transportation by: [facility]  Nurse to call report to [phone].   CSW signing off.  Estanislado Emms, [place]  Clinical Social Worker

## 2018-03-23 NOTE — Plan of Care (Signed)
  Problem: Elimination: Goal: Will not experience complications related to bowel motility Outcome: Progressing   

## 2018-03-23 NOTE — Discharge Summary (Signed)
Physician Discharge Summary  Rita Holmes PIR:[id number] DOB: Apr 06, 1931 DOA: 03/18/2018  PCP: Dierdre Searles, MD  Admit date: 03/18/2018 Discharge date: 03/23/2018  Admitted From: Skilled nursing facility Disposition: Skilled nursing facility  Recommendations for Outpatient Follow-up:  1. Follow up with PCP in 1-2 weeks 2. Please obtain BMP/CBC in one week   Home Health: No Equipment/Devices: Walker  Discharge Condition: Stable CODE STATUS: Full Diet recommendation: Heart Healthy  Brief/Interim Summary:  #) Right leg pain due to soleal DVT: Patient was transferred due to concern for critical limb ischemia from any pain.  Vascular surgery did not feel the patient's symptoms were related to critical limb ischemia.  DVT ultrasound showed soleal vein DVT and because of patient's severe pain it was opted to treat her.  Patient was started on apixaban 10 mg twice daily for 1 week and then decrease to 5 mg twice daily.  #)atrial fibrillation with RVR: Patient presented with A. fib with RVR.  She was placed on a diltiazem drip and then transition to oral diltiazem.  She had been on rivaroxaban in the past however this had not been started at her skilled nursing facility for several months.  She was discharged on apixaban.  Patient was also continued on her home metoprolol tartrate 25 mg twice daily.  #) Chest pain with elevated troponin with coronary artery disease status post CABG: Patient had mild elevations in troponin.  Urology was consulted and did not feel that this was consistent with ACS but rather demand from her A. fib with RVR.  She was continued on her home clopidogrel, pravastatin, metoprolol, lisinopril.  #) Peripheral vascular disease status post stenting and surgery: Patient had ABIs that were consistent with severe vascular disease.  CT imaging at any pain showed bilateral intact stents and chronic occlusion of right SFA stent with patent left femoral-popliteal bypass  graft.  Vascular surgery felt that her leg pain was not related to her peripheral vascular disease and did not feel critical limb ischemia was the likely cause of her leg pain.  It was found later that the DVT was the most likely cause.  She was continued on statin, clopidogrel.  #) COPD: Patient was continued on PRN bronchodilators, LABA/L AMA/ICS.  #) Hypothyroidism: Patient was continued on home levothyroxine 125 mcg.  TSH, free T4 were normal.  #) Chronic anemia: This was stable during this hospitalization.  #) Hypertension/hyperlipidemia: Patient was continued on ACE inhibitor, beta-blocker, diltiazem, statin.  #) Pain/psych: Patient was continued on gabapentin and tramadol was started for pain from her leg.  Discharge Diagnoses:  Principal Problem:   Atrial fibrillation with RVR ([place]) Active Problems:   Peripheral arterial disease (HCC)   Chest pain   COPD (chronic obstructive pulmonary disease) (HCC)   Hypothyroidism   Hypertension   Elevated troponin   Intractable pain   CAD (coronary artery disease)   Chronic systolic CHF (congestive heart failure) (HCC)   Pressure injury of skin    Discharge Instructions  Discharge Instructions    Call MD for:  difficulty breathing, headache or visual disturbances   Complete by:  As directed    Call MD for:  extreme fatigue   Complete by:  As directed    Call MD for:  persistant dizziness or light-headedness   Complete by:  As directed    Call MD for:  persistant nausea and vomiting   Complete by:  As directed    Call MD for:  redness, tenderness, or signs of infection (  pain, swelling, redness, odor or green/yellow discharge around incision site)   Complete by:  As directed    Call MD for:  severe uncontrolled pain   Complete by:  As directed    Call MD for:  temperature >100.4   Complete by:  As directed    Diet - low sodium heart healthy   Complete by:  As directed    Discharge instructions   Complete by:  As directed     Please follow-up with your primary care doctor in 1 to 2 weeks.   Increase activity slowly   Complete by:  As directed      Allergies as of 03/23/2018      Reactions   Atorvastatin Other (See Comments), Hives   Hives.   Diclofenac Sodium Nausea And Vomiting, Palpitations   Hydrocodone Itching      Medication List    STOP taking these medications   tamoxifen 20 MG tablet Commonly known as:  NOLVADEX     TAKE these medications   acetaminophen 325 MG tablet Commonly known as:  TYLENOL Take 650 mg by mouth 3 (three) times daily.   apixaban 5 MG Tabs tablet Commonly known as:  ELIQUIS Take 2 tablets (10 mg total) by mouth 2 (two) times daily for 6 days.   apixaban 5 MG Tabs tablet Commonly known as:  ELIQUIS Take 1 tablet (5 mg total) by mouth 2 (two) times daily. Start taking on:  03/29/2018   BREO ELLIPTA 100-25 MCG/INH Aepb Generic drug:  fluticasone furoate-vilanterol Inhale 1 puff into the lungs daily.   Calcium Carb-Ergocalciferol 500-200 MG-UNIT Tabs Take by mouth.   clopidogrel 75 MG tablet Commonly known as:  PLAVIX Take 75 mg by mouth daily.   diltiazem 240 MG 24 hr capsule Commonly known as:  CARDIZEM CD Take 1 capsule (240 mg total) by mouth daily. Start taking on:  03/24/2018   diphenhydrAMINE 25 MG tablet Commonly known as:  BENADRYL Take 25 mg by mouth every 8 (eight) hours as needed for itching.   docusate sodium 100 MG capsule Commonly known as:  COLACE Take 200 mg by mouth daily.   Fish Oil 1000 MG Caps Take 1,000 mg by mouth at bedtime.   fluticasone 50 MCG/ACT nasal spray Commonly known as:  FLONASE Place 2 sprays into both nostrils daily.   furosemide 20 MG tablet Commonly known as:  LASIX Take 20 mg by mouth 2 (two) times daily.   gabapentin 100 MG capsule Commonly known as:  NEURONTIN Take 100 mg by mouth 3 (three) times daily.   ipratropium-albuterol 0.5-2.5 (3) MG/3ML Soln Commonly known as:  DUONEB Take 3 mLs by nebulization  3 (three) times daily.   levothyroxine 125 MCG tablet Commonly known as:  SYNTHROID, LEVOTHROID Take 125 mcg by mouth daily before breakfast.   lisinopril 5 MG tablet Commonly known as:  PRINIVIL,ZESTRIL Take 5 mg by mouth daily.   metoprolol tartrate 25 MG tablet Commonly known as:  LOPRESSOR Take 25 mg by mouth 2 (two) times daily.   nitroGLYCERIN 0.4 MG SL tablet Commonly known as:  NITROSTAT Place 0.4 mg under the tongue every 5 (five) minutes as needed for chest pain.   polyethylene glycol packet Commonly known as:  MIRALAX / GLYCOLAX Take 17 g by mouth daily as needed.   potassium chloride 10 MEQ tablet Commonly known as:  K-DUR Take 10 mEq by mouth daily.   pravastatin 40 MG tablet Commonly known as:  PRAVACHOL Take 40 mg by  mouth daily.   PROVENTIL HFA 108 (90 Base) MCG/ACT inhaler Generic drug:  albuterol Inhale 2 puffs into the lungs every 6 (six) hours as needed for wheezing or shortness of breath.   traMADol 50 MG tablet Commonly known as:  ULTRAM Take 1 tablet (50 mg total) by mouth every 6 (six) hours as needed for moderate pain. What changed:    when to take this  reasons to take this   traZODone 50 MG tablet Commonly known as:  DESYREL Take 25 mg by mouth at bedtime.   vitamin B-12 500 MCG tablet Commonly known as:  CYANOCOBALAMIN Take 1,000 mcg by mouth at bedtime.      Contact information for after-discharge care    [facility] .   Service:  Skilled Nursing Contact information: [address] [place] [address] [phone]             Allergies  Allergen Reactions  . Atorvastatin Other (See Comments) and Hives    Hives.   . Diclofenac Sodium Nausea And Vomiting and Palpitations  . Hydrocodone Itching    Consultations:  Vascular surgery  Cardiology   Procedures/Studies: Ct Angio Aortobifemoral W And/or Wo Contrast  Result Date: 03/18/2018 CLINICAL DATA:  Right  leg pain and chest pain history of arterial occlusion EXAM: CT ANGIOGRAPHY OF ABDOMINAL AORTA WITH ILIOFEMORAL RUNOFF TECHNIQUE: Multidetector CT imaging of the abdomen, pelvis and lower extremities was performed using the standard protocol during bolus administration of intravenous contrast. Multiplanar CT image reconstructions and MIPs were obtained to evaluate the vascular anatomy. CONTRAST:  100 mL Isovue 370 intravenous COMPARISON:  CTA runoff 07/07/2016, CT abdomen pelvis 10/25/2017, 06/18/2017 FINDINGS: VASCULAR Aorta: Moderate severe aortic atherosclerosis with diffuse mural thrombus. Infrarenal abdominal aortic aneurysm measuring 4 cm maximum dimension over a craniocaudad span of 5.3 cm. Extensive mural thrombus in the aneurysm sac. No dissection seen. Celiac: Heavily calcified at the origin with at least moderate stenosis suspected. Heavily calcified splenic artery without aneurysm formation. SMA: Heavily calcified at the origin. Severe stenosis at the origin with moderate diffuse disease of SMA distal branches with flow enhancement present distally. Renals: Heavily calcified at the origin with moderate to severe stenosis suspected. S IMA: Patent but also heavily calcified at the origin. RIGHT Lower Extremity Inflow: Bilateral iliac stents. Right limb of the stent is patent. Mild to moderate calcific disease of the external iliac artery on the right but with patency. Heavily calcified origin of right internal iliac artery with moderate severe diffuse disease present. Outflow: Moderate calcification of the right common femoral artery. Chronic occlusion of the right SFA just past the origin with occluded SFA stent as before. Deep femoral arterial branches are patent. Limited evaluation of popliteal artery due to streak artifact from metallic hardware. Heavily calcified popliteal artery. Runoff: Difficult due to diffuse calcific disease. Primary runoff via the anterior tibial artery which demonstrates diffuse  disease. Diffusely diseased posterior tibial artery. Distal posterior tibial artery appears occluded just above the ankle. Reconstitution of the distal peroneal artery via small collateral vessels. Flow enhancement visible within the dorsalis pedis artery. LEFT Lower Extremity Inflow: Bilateral iliac stent. Narrowed appearance of the left limb but with patency noted. Mild disease of the external iliac artery but with patency. Heavily calcified left internal iliac artery. Outflow: Deep femoral artery is patent with calcific disease of distal branches. Chronic occlusion of the native superficial femoral artery just be on the origin. Left femoropopliteal bypass graft  to above the knee with mural disease and narrowing present throughout the graft but with flow enhancement/patency established. Moderate calcific disease of the native popliteal artery with moderate stenosis. Runoff: Diseased runoff via the anterior tibial artery with flow enhancement in the dorsal pedalis. Diminutive posterior tibial artery with diffuse disease. Diminutive peroneal which occludes in the calf. Review of the MIP images confirms the above findings. NON-VASCULAR Lower chest: Nonacute. Hepatobiliary: No focal liver abnormality is seen. No gallstones, gallbladder wall thickening, or biliary dilatation. Pancreas: Unremarkable. No pancreatic ductal dilatation or surrounding inflammatory changes. Spleen: Normal in size without focal abnormality. Adrenals/Urinary Tract: Adrenal glands are unremarkable. Kidneys are normal, without renal calculi, focal lesion, or hydronephrosis. Bladder is unremarkable. Stomach/Bowel: Stomach is nonenlarged. No dilated small bowel. Colon shows no focal wall thickening. Sigmoid colon diverticular disease Lymphatic: No significantly enlarged lymph nodes Reproductive: Prominent uterus for age, no adnexal mass. Other: Negative for free air or free fluid. Small fat in the umbilical region Musculoskeletal: Post sternotomy  changes. Posterior spinal rods and fixating screws L1 through L5. 2 cm right popliteal fossa cyst. Mild diffuse atrophy of the left calf muscles. Subcutaneous edema within the left lower extremity at the ankle and foot. No acute osseous abnormality is seen. Metallic hardware in the right knee from replacement. IMPRESSION: VASCULAR 1. Infrarenal abdominal aortic aneurysm measuring up to 4 cm. Recommend followup by ultrasound in 1 year. This recommendation follows ACR consensus guidelines: White Paper of the ACR Incidental Findings Committee II on Vascular Findings. J Am Coll Radiol 2013; 10:789-794. No acute aortic dissection is seen. 2. Status post bilateral iliac artery stents with stent patency. 3. 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 Chronically occluded right SFA stent. Diseased runoff, primarily via the right anterior tibial artery with reconstitution of the distal popliteal artery as before. 4. Status post left femoropopliteal bypass graft with diseased but patent graft. 5. Heavy calcific disease of major branch vessels within the abdomen with suspected high-grade stenosis at the origin of the SMA. NON-VASCULAR 1. No CT evidence for acute intra-abdominal or pelvic abnormality. 2. Sigmoid colon diverticular disease without acute inflammation 3. 2 cm right popliteal fossa cyst Electronically Signed   By: Donavan Foil M.D.   On: 03/18/2018 20:12   Dg Chest Port 1 View  Result Date: 03/21/2018 CLINICAL DATA:  Cough. EXAM: PORTABLE CHEST 1 VIEW COMPARISON:  03/18/2018 and older exams. FINDINGS: There stable changes from prior CABG surgery. Left anterior chest wall sequential pacemaker and loop recorder are stable. No mediastinal or hilar masses. Lungs are hyperexpanded. There are prominent bronchovascular markings most evident at the bases. No evidence of pneumonia or pulmonary  edema. No pleural effusion or pneumothorax. Skeletal structures are demineralized but grossly intact. IMPRESSION: No acute cardiopulmonary disease. Electronically Signed   By: Lajean Manes M.D.   On: 03/21/2018 10:08   Ct Angio Chest Aorta W/cm &/or Wo/cm  Result Date: 03/18/2018 CLINICAL DATA:  Chest pain EXAM: CT ANGIOGRAPHY CHEST WITH CONTRAST TECHNIQUE: Multidetector CT imaging of the chest was performed using the standard protocol during bolus administration of intravenous contrast. Multiplanar CT image reconstructions and MIPs were obtained to evaluate the vascular anatomy. CONTRAST:  100 mL Isovue 370 intravenous COMPARISON:  Chest x-ray 03/18/2018, PET-CT 05/21/2017, CT a chest 03/09/2017 FINDINGS: Cardiovascular: Nonaneurysmal aorta. No dissection seen. Go not tailored for pulmonary embolus study, no gross central filling defects are seen.  Moderate aortic atherosclerosis. Left-sided arch with 3 vessel origin. Post CABG changes. Coronary vascular calcification. Heart size upper normal. No pericardial effusion. Mediastinum/Nodes: Midline trachea. No significant mediastinal adenopathy. Small distal esophageal hiatal hernia. Lungs/Pleura: Stable 4 mm right upper lobe pulmonary nodule. Mild emphysema. No acute consolidation or effusion. No pneumothorax. Upper Abdomen: No acute abnormality. Musculoskeletal: Post sternotomy changes. No acute or suspicious abnormality. Post left mastectomy. Review of the MIP images confirms the above findings. IMPRESSION: 1. Negative for aortic dissection or aneurysm. No gross central filling defects within the pulmonary arterial system. 2. Mild emphysema.  No acute consolidation or pleural effusion. Aortic Atherosclerosis (ICD10-I70.0) and Emphysema (ICD10-J43.9). Electronically Signed   By: Donavan Foil M.D.   On: 03/18/2018 19:02    ABI 03/21/2018:ABI's havebeen completed. Right 0.33 Left 0.39     Subjective:   Discharge Exam: Vitals:   03/23/18 0827  03/23/18 0841  BP:  (!) 115/49  Pulse:  85  Resp:    Temp:    SpO2: 96%    Vitals:   03/22/18 2112 03/23/18 0520 03/23/18 0827 03/23/18 0841  BP:  (!) 91/52  (!) 115/49  Pulse:  66  85  Resp:  15    Temp:  98.3 F (36.8 C)    TempSrc:  Oral    SpO2:  97% 96%   Weight: 71.8 kg (158 lb 3.2 oz) 71.7 kg (158 lb 2.9 oz)    Height:        General exam: Appears calm and comfortable  Respiratory system: Clear to auscultation.  Scattered rhonchi, diminished lung sounds at bases Cardiovascular system: Irregularly irregular, no murmurs Gastrointestinal system: Soft, nondistended, no rebound or guarding, plus bowel sounds Central nervous system: Alert and oriented. No focal neurological deficits. Extremities: Unable to palpate pulses in bilateral lower extremities, slow cap refill, groin bilaterally noted to have femoral pulses, no inguinal lymphadenopathy appreciated Skin: Small and bite on tip of toes Psychiatry: Judgement and insight appear normal. Mood & affect appropriate.         The results of significant diagnostics from this hospitalization (including imaging, microbiology, ancillary and laboratory) are listed below for reference.     Microbiology: Recent Results (from the past 240 hour(s))  MRSA PCR Screening     Status: None   Collection Time: 03/18/18 11:17 PM  Result Value Ref Range Status   MRSA by PCR NEGATIVE NEGATIVE Final    Comment:        The GeneXpert MRSA Assay (FDA approved for NASAL specimens only), is one component of a comprehensive MRSA colonization surveillance program. It is not intended to diagnose MRSA infection nor to guide or monitor treatment for MRSA infections. Performed at [facility], [place] [address], [place], [place] [address]   Culture, blood (routine x 2)     Status: None (Preliminary result)   Collection Time: 03/21/18  8:20 AM  Result Value Ref Range Status   Specimen Description BLOOD RIGHT ANTECUBITAL  Final    Special Requests   Final    BOTTLES DRAWN AEROBIC ONLY Blood Culture results may not be optimal due to an inadequate volume of blood received in culture bottles   Culture   Final    NO GROWTH 2 DAYS Performed at [facility], [place] [address], [place], [place] [address]    Report Status PENDING  Incomplete  Culture, blood (routine x 2)     Status: None (Preliminary result)   Collection Time: 03/21/18  8:23 AM  Result Value Ref Range Status   Specimen Description BLOOD RIGHT ANTECUBITAL  Final   Special Requests   Final    BOTTLES DRAWN AEROBIC ONLY Blood Culture results may not be optimal due to an inadequate volume of blood received in culture bottles   Culture   Final    NO GROWTH 2 DAYS Performed at [place] [address], [place], [place] [address]    Report Status PENDING  Incomplete     Labs: BNP (last 3 results) No results for input(s): BNP in the last 8760 hours. Basic Metabolic Panel: Recent Labs  Lab 03/18/18 2103 03/19/18 0829 03/20/18 0508 03/21/18 0814 03/22/18 0556  NA 139 137 138 133* 137  K 4.1 4.2 3.8 4.2 4.3  CL 109 108 108 103 106  CO2 21* 18* 24 24 22   GLUCOSE 116* 96 101* 95 103*  BUN 12 12 10 11 12   CREATININE 0.96 0.92 0.86 0.81 0.82  CALCIUM 7.7* 8.0* 7.8* 8.0* 8.3*  MG  --   --  1.7 1.8 1.9   Liver Function Tests: Recent Labs  Lab 03/18/18 2103 03/20/18 0508  AST 19 19  ALT 12* 13*  ALKPHOS 29* 25*  BILITOT 0.6 0.6  PROT 5.1* 4.7*  ALBUMIN 3.0* 2.7*   No results for input(s): LIPASE, AMYLASE in the last 168 hours. No results for input(s): AMMONIA in the last 168 hours. CBC: Recent Labs  Lab 03/18/18 2103 03/19/18 0829 03/20/18 0508 03/21/18 0814 03/22/18 0556  WBC 7.5 10.2 7.3 7.0 6.7  NEUTROABS 5.2  --   --   --   --   HGB 9.7* 10.0* 8.3* 9.2* 9.2*  HCT 31.3* 31.9* 26.4* 30.0* 30.0*  MCV 104.7* 102.6* 102.7* 104.5* 103.4*  PLT 164 176 158 165 164   Cardiac Enzymes: Recent Labs  Lab 03/18/18 2103  03/19/18 0254 03/19/18 1119 03/21/18 0814  CKTOTAL  --   --   --  112  TROPONINI 0.16* 0.17* 0.14*  --    BNP: Invalid input(s): POCBNP CBG: No results for input(s): GLUCAP in the last 168 hours. D-Dimer No results for input(s): DDIMER in the last 72 hours. Hgb A1c No results for input(s): HGBA1C in the last 72 hours. Lipid Profile No results for input(s): CHOL, HDL, LDLCALC, TRIG, CHOLHDL, LDLDIRECT in the last 72 hours. Thyroid function studies No results for input(s): TSH, T4TOTAL, T3FREE, THYROIDAB in the last 72 hours.  Invalid input(s): FREET3 Anemia work up Recent Labs    03/21/18 0814  VITAMINB12 1,211*  FOLATE 14.2   Urinalysis    Component Value Date/Time   COLORURINE YELLOW 08/03/2008 1128   APPEARANCEUR CLEAR 08/03/2008 1128   LABSPEC 1.007 08/03/2008 1128   PHURINE 7.5 08/03/2008 1128   GLUCOSEU NEGATIVE 08/03/2008 1128   HGBUR NEGATIVE 08/03/2008 1128   BILIRUBINUR NEGATIVE 08/03/2008 1128   KETONESUR NEGATIVE 08/03/2008 1128   PROTEINUR NEGATIVE 08/03/2008 1128   UROBILINOGEN 0.2 08/03/2008 1128   NITRITE NEGATIVE 08/03/2008 1128   LEUKOCYTESUR  08/03/2008 1128    NEGATIVE MICROSCOPIC NOT DONE ON URINES WITH NEGATIVE PROTEIN, BLOOD, LEUKOCYTES, NITRITE, OR GLUCOSE <1000 mg/dL.   Sepsis Labs Invalid input(s): PROCALCITONIN,  WBC,  LACTICIDVEN Microbiology Recent Results (from the past 240 hour(s))  MRSA PCR Screening     Status: None   Collection Time: 03/18/18 11:17 PM  Result Value Ref Range Status   MRSA by PCR NEGATIVE NEGATIVE Final    Comment:        The  GeneXpert MRSA Assay (FDA approved for NASAL specimens only), is one component of a comprehensive MRSA colonization surveillance program. It is not intended to diagnose MRSA infection nor to guide or monitor treatment for MRSA infections. Performed at [facility], [place] [address] [place], [place] [address]   Culture, blood (routine x 2)     Status: None (Preliminary result)    Collection Time: 03/21/18  8:20 AM  Result Value Ref Range Status   Specimen Description BLOOD RIGHT ANTECUBITAL  Final   Special Requests   Final    BOTTLES DRAWN AEROBIC ONLY Blood Culture results may not be optimal due to an inadequate volume of blood received in culture bottles   Culture   Final    NO GROWTH 2 DAYS Performed at [facility], [place] [address], [place], [place] [address]    Report Status PENDING  Incomplete  Culture, blood (routine x 2)     Status: None (Preliminary result)   Collection Time: 03/21/18  8:23 AM  Result Value Ref Range Status   Specimen Description BLOOD RIGHT ANTECUBITAL  Final   Special Requests   Final    BOTTLES DRAWN AEROBIC ONLY Blood Culture results may not be optimal due to an inadequate volume of blood received in culture bottles   Culture   Final    NO GROWTH 2 DAYS Performed at [facility], [place] [address], [place], [place] [address]    Report Status PENDING  Incomplete     Time coordinating discharge: Over 30 minutes  SIGNED:   Cristy Folks, MD  [facility] 03/23/2018, 11:56 AM   If 7PM-7AM, please contact night-coverage [email or web address] Password [email or web address]

## 2018-03-26 LAB — CULTURE, BLOOD (ROUTINE X 2)
Culture: NO GROWTH
Culture: NO GROWTH

## 2018-09-29 DEATH — deceased

## 2019-11-23 IMAGING — DX DG CHEST 1V PORT
1 series · 1 of 1 positions shown · non-contrast
Comparison: 03/18/2018 and older exams.

CLINICAL DATA: Cough.

EXAM:
PORTABLE CHEST 1 VIEW

[chest]
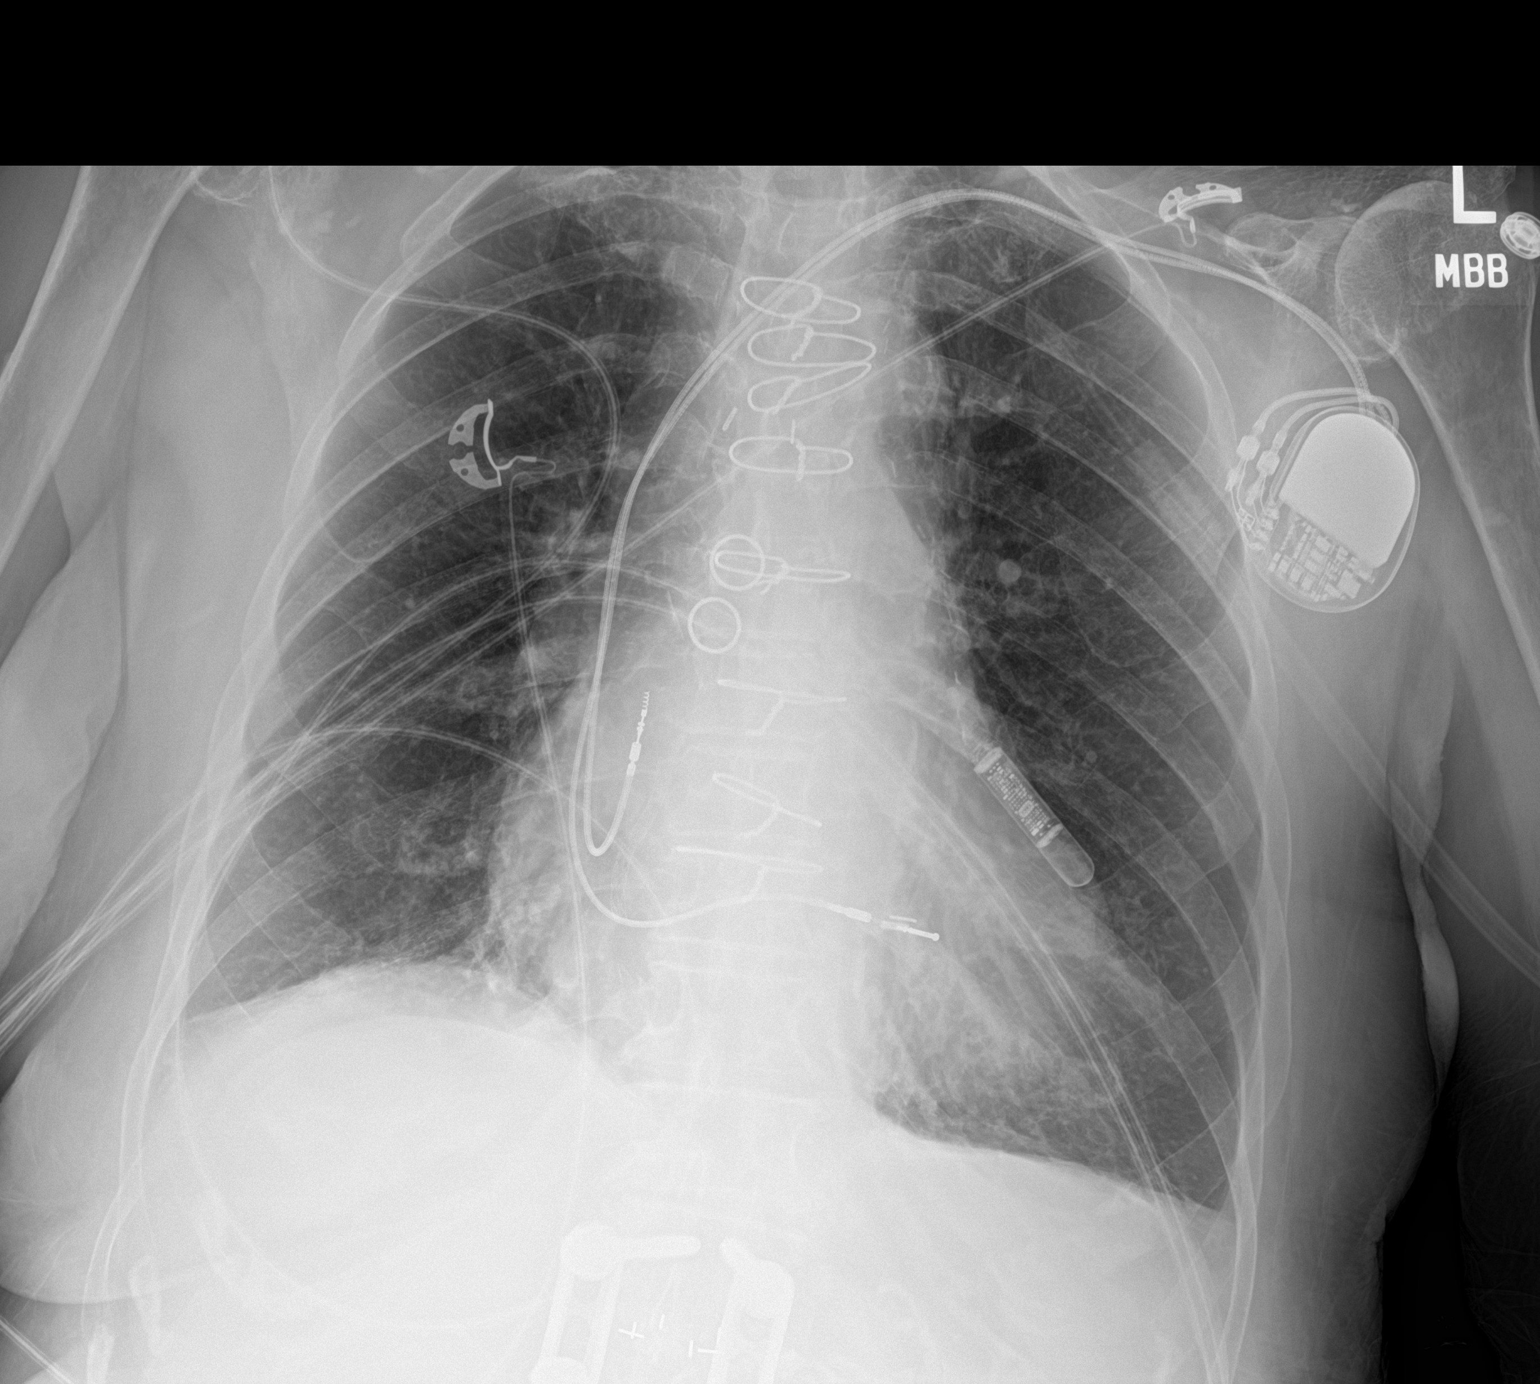

[1 of 1 positions shown; findings below may reference images not displayed]

FINDINGS: There stable changes from prior CABG surgery. Left anterior chest
wall sequential pacemaker and loop recorder are stable. No
mediastinal or hilar masses.

Lungs are hyperexpanded. There are prominent bronchovascular
markings most evident at the bases. No evidence of pneumonia or
pulmonary edema. No pleural effusion or pneumothorax.

Skeletal structures are demineralized but grossly intact.
IMPRESSION: No acute cardiopulmonary disease.
